# Patient Record
Sex: Female | Born: 1960 | Race: White | Hispanic: No | Marital: Married | Smoking: Never smoker
Health system: Southern US, Community
[De-identification: ages and names within clinical notes are randomized; demographics above are authoritative.]

## PROBLEM LIST (undated history)

## (undated) DIAGNOSIS — D649 Anemia, unspecified: Secondary | ICD-10-CM

## (undated) DIAGNOSIS — Z5189 Encounter for other specified aftercare: Secondary | ICD-10-CM

## (undated) DIAGNOSIS — E039 Hypothyroidism, unspecified: Secondary | ICD-10-CM

## (undated) HISTORY — PX: ABDOMINAL HYSTERECTOMY: SHX81

## (undated) HISTORY — PX: KNEE ARTHROSCOPY: SUR90

## (undated) HISTORY — DX: Hypothyroidism, unspecified: E03.9

## (undated) HISTORY — PX: TUBAL LIGATION: SHX77

---

## 1997-06-06 ENCOUNTER — Other Ambulatory Visit: Admission: RE | Admit: 1997-06-06 | Discharge: 1997-06-06 | Payer: Self-pay | Admitting: *Deleted

## 1999-07-30 ENCOUNTER — Other Ambulatory Visit: Admission: RE | Admit: 1999-07-30 | Discharge: 1999-07-30 | Payer: Self-pay | Admitting: Obstetrics and Gynecology

## 2000-04-07 ENCOUNTER — Other Ambulatory Visit: Admission: RE | Admit: 2000-04-07 | Discharge: 2000-04-07 | Payer: Self-pay | Admitting: Obstetrics and Gynecology

## 2000-04-07 ENCOUNTER — Encounter (INDEPENDENT_AMBULATORY_CARE_PROVIDER_SITE_OTHER): Payer: Self-pay | Admitting: *Deleted

## 2000-11-14 ENCOUNTER — Encounter: Admission: RE | Admit: 2000-11-14 | Discharge: 2000-11-14 | Payer: Self-pay | Admitting: Obstetrics and Gynecology

## 2000-11-14 ENCOUNTER — Encounter: Payer: Self-pay | Admitting: Obstetrics and Gynecology

## 2003-07-18 ENCOUNTER — Encounter: Admission: RE | Admit: 2003-07-18 | Discharge: 2003-07-18 | Payer: Self-pay | Admitting: Obstetrics and Gynecology

## 2004-07-23 ENCOUNTER — Encounter: Admission: RE | Admit: 2004-07-23 | Discharge: 2004-07-23 | Payer: Self-pay | Admitting: Obstetrics and Gynecology

## 2005-07-26 ENCOUNTER — Encounter: Admission: RE | Admit: 2005-07-26 | Discharge: 2005-07-26 | Payer: Self-pay | Admitting: Obstetrics and Gynecology

## 2005-10-12 ENCOUNTER — Ambulatory Visit: Payer: Self-pay | Admitting: Family Medicine

## 2006-09-29 ENCOUNTER — Encounter: Admission: RE | Admit: 2006-09-29 | Discharge: 2006-09-29 | Payer: Self-pay | Admitting: Obstetrics and Gynecology

## 2007-04-24 ENCOUNTER — Ambulatory Visit: Payer: Self-pay | Admitting: Family Medicine

## 2007-10-05 ENCOUNTER — Encounter: Admission: RE | Admit: 2007-10-05 | Discharge: 2007-10-05 | Payer: Self-pay | Admitting: Obstetrics and Gynecology

## 2007-11-13 ENCOUNTER — Ambulatory Visit: Payer: Self-pay | Admitting: Family Medicine

## 2008-10-03 ENCOUNTER — Ambulatory Visit: Payer: Self-pay | Admitting: Family Medicine

## 2009-01-18 DIAGNOSIS — IMO0001 Reserved for inherently not codable concepts without codable children: Secondary | ICD-10-CM

## 2009-01-18 DIAGNOSIS — Z5189 Encounter for other specified aftercare: Secondary | ICD-10-CM

## 2009-01-18 HISTORY — DX: Encounter for other specified aftercare: Z51.89

## 2009-01-18 HISTORY — DX: Reserved for inherently not codable concepts without codable children: IMO0001

## 2009-09-16 ENCOUNTER — Encounter: Admission: RE | Admit: 2009-09-16 | Discharge: 2009-09-16 | Payer: Self-pay | Admitting: Obstetrics and Gynecology

## 2010-01-16 ENCOUNTER — Encounter (HOSPITAL_COMMUNITY)
Admission: RE | Admit: 2010-01-16 | Discharge: 2010-02-17 | Payer: Self-pay | Source: Home / Self Care | Attending: Family Medicine | Admitting: Family Medicine

## 2010-01-16 ENCOUNTER — Ambulatory Visit
Admission: RE | Admit: 2010-01-16 | Discharge: 2010-01-16 | Payer: Self-pay | Source: Home / Self Care | Attending: Family Medicine | Admitting: Family Medicine

## 2010-03-30 LAB — CROSSMATCH
ABO/RH(D): O POS
Unit division: 0

## 2010-03-30 LAB — ABO/RH: ABO/RH(D): O POS

## 2011-01-07 ENCOUNTER — Telehealth: Payer: Self-pay | Admitting: Family Medicine

## 2011-01-07 NOTE — Telephone Encounter (Signed)
Faith Fernandez with [facility] wants [place] to add CBC to her next visit of 01/29/11 for her medical clearance.

## 2011-01-21 ENCOUNTER — Encounter (HOSPITAL_COMMUNITY): Payer: Self-pay

## 2011-01-22 ENCOUNTER — Encounter: Payer: Self-pay | Admitting: Internal Medicine

## 2011-01-25 ENCOUNTER — Other Ambulatory Visit: Payer: Self-pay | Admitting: Obstetrics

## 2011-01-28 ENCOUNTER — Encounter (HOSPITAL_COMMUNITY): Payer: Self-pay

## 2011-01-28 ENCOUNTER — Encounter (HOSPITAL_COMMUNITY)
Admission: RE | Admit: 2011-01-28 | Discharge: 2011-01-28 | Disposition: A | Discharge status: Home / Self Care | Payer: 59 | Source: Ambulatory Visit | Attending: Obstetrics | Admitting: Obstetrics

## 2011-01-28 HISTORY — DX: Anemia, unspecified: D64.9

## 2011-01-28 HISTORY — DX: Encounter for other specified aftercare: Z51.89

## 2011-01-28 NOTE — Pre-Procedure Instructions (Addendum)
Patient requested her labs be done thru [facility] b/c her husband is employee there. T&S and UPT have to be done here [facility] and I explained that to pt but she will have BMP and UA done at [facility]..I spoke with Hughes Better at Dr. Elpidio Eric office and she ok'd labs to be done at [facility].

## 2011-01-28 NOTE — Patient Instructions (Addendum)
YOUR PROCEDURE IS SCHEDULED ON:02/04/11  ENTER THROUGH THE MAIN ENTRANCE OF [facility] AT:1145 am  USE DESK PHONE AND DIAL [phone] TO INFORM us OF YOUR ARRIVAL  CALL [phone] IF YOU HAVE ANY QUESTIONS OR PROBLEMS PRIOR TO YOUR ARRIVAL.  REMEMBER: DO NOT EAT AFTER MIDNIGHT : Wed  SPECIAL INSTRUCTIONS:clear liquids ok until 9am Thursday   YOU MAY BRUSH YOUR TEETH THE MORNING OF SURGERY   TAKE THESE MEDICINES THE DAY OF SURGERY WITH SIP OF WATER:Synthroid   DO NOT WEAR JEWELRY, EYE MAKEUP, LIPSTICK OR DARK FINGERNAIL POLISH DO NOT WEAR LOTIONS  DO NOT SHAVE FOR 48 HOURS PRIOR TO SURGERY  YOU WILL NOT BE ALLOWED TO DRIVE YOURSELF HOME.  NAME OF DRIVER:David Darl Pikes

## 2011-01-29 ENCOUNTER — Ambulatory Visit (INDEPENDENT_AMBULATORY_CARE_PROVIDER_SITE_OTHER): Payer: 59 | Admitting: Family Medicine

## 2011-01-29 ENCOUNTER — Encounter: Payer: Self-pay | Admitting: Family Medicine

## 2011-01-29 VITALS — BP 124/70 | HR 64 | Ht 66.0 in | Wt 176.0 lb

## 2011-01-29 DIAGNOSIS — E039 Hypothyroidism, unspecified: Secondary | ICD-10-CM

## 2011-01-29 DIAGNOSIS — Z8249 Family history of ischemic heart disease and other diseases of the circulatory system: Secondary | ICD-10-CM | POA: Insufficient documentation

## 2011-01-29 NOTE — Patient Instructions (Signed)
Cardiology will clear you prior to surgery. We will fax over the blood work when we get it.

## 2011-01-29 NOTE — Progress Notes (Signed)
  Subjective:    Patient ID: Amiliana Foutz, female    DOB: 07/14/1960, 51 y.o.   MRN: [id number]  HPI She is here for a preoperative evaluation. She has had difficulties with menorrhagia and is scheduled for a hysterectomy. The notes from the gynecologist were reviewed. Jill Alexanders has a history of hypothyroid and continues on her thyroid medication. Her family history is significant for father having an MI at age 41. She has not ever had a cardiology evaluation. She's had no difficulty with chest pain, shortness of breath, weakness. She does not smoke.   Review of Systems     Objective:   Physical Exam alert and in no distress. Tympanic membranes and canals are normal. Throat is clear. Tonsils are normal. Neck is supple without adenopathy or thyromegaly. Cardiac exam shows a regular sinus rhythm without murmurs or gallops. Lungs are clear to auscultation. EKG shows no acute changes       Assessment & Plan:   1. Family history of heart disease in female family member before age 47  PR ELECTROCARDIOGRAM, COMPLETE, Ambulatory referral to Cardiology  2. Hypothyroid     I will check TSH and do lipid panel. Will also get blood work from her gynecologist. Refer to cardiology prior to having surgery.

## 2011-02-02 ENCOUNTER — Telehealth: Payer: Self-pay | Admitting: Internal Medicine

## 2011-02-02 MED ORDER — ATORVASTATIN CALCIUM 10 MG PO TABS: 10.0000 mg | ORAL_TABLET | Freq: Every day | ORAL | Status: DC

## 2011-02-02 NOTE — Telephone Encounter (Signed)
Her labs were done to [facility]. The LDL is elevated and I would like to start her on Lipitor 10 mg. N we'll send the lab data to Dr. Donnie Aho. She will need to have blood work done in about 2 months.

## 2011-02-02 NOTE — Telephone Encounter (Signed)
Pt was notified and filled lipitor 10mg  to [facility] and faxed over labs to [facility] and clearance to [facility]

## 2011-02-02 NOTE — Telephone Encounter (Signed)
pt called and left message on cheri voicemail stating that she wanted to know her lab results and she also wanted to see if everything was ok and had gone through for her medical clearance for her surgery? However, i do not see there were any labs done when she came in for her medical clearance appt.

## 2011-02-04 ENCOUNTER — Encounter (HOSPITAL_COMMUNITY): Payer: Self-pay | Admitting: Anesthesiology

## 2011-02-04 ENCOUNTER — Encounter (HOSPITAL_COMMUNITY)
Admission: RE | Disposition: A | Discharge status: Home / Self Care | Payer: Self-pay | Source: Ambulatory Visit | Attending: Obstetrics

## 2011-02-04 ENCOUNTER — Ambulatory Visit (HOSPITAL_COMMUNITY)
Admission: RE | Admit: 2011-02-04 | Discharge: 2011-02-05 | Disposition: A | Discharge status: Home / Self Care | Payer: 59 | Source: Ambulatory Visit | Attending: Obstetrics | Admitting: Obstetrics

## 2011-02-04 ENCOUNTER — Encounter (HOSPITAL_COMMUNITY): Payer: Self-pay | Admitting: *Deleted

## 2011-02-04 ENCOUNTER — Ambulatory Visit (HOSPITAL_COMMUNITY): Payer: 59 | Admitting: Anesthesiology

## 2011-02-04 DIAGNOSIS — Z01818 Encounter for other preprocedural examination: Secondary | ICD-10-CM | POA: Insufficient documentation

## 2011-02-04 DIAGNOSIS — N920 Excessive and frequent menstruation with regular cycle: Secondary | ICD-10-CM | POA: Insufficient documentation

## 2011-02-04 DIAGNOSIS — D259 Leiomyoma of uterus, unspecified: Secondary | ICD-10-CM | POA: Insufficient documentation

## 2011-02-04 DIAGNOSIS — N8000 Endometriosis of the uterus, unspecified: Secondary | ICD-10-CM | POA: Insufficient documentation

## 2011-02-04 DIAGNOSIS — Z01812 Encounter for preprocedural laboratory examination: Secondary | ICD-10-CM | POA: Insufficient documentation

## 2011-02-04 LAB — TYPE AND SCREEN: ABO/RH(D): O POS

## 2011-02-04 LAB — CBC
Hemoglobin: 13.8 g/dL (ref 12.0–15.0)
MCH: 33.1 pg (ref 26.0–34.0)
MCHC: 35.1 g/dL (ref 30.0–36.0)
MCV: 94.2 fL (ref 78.0–100.0)
RBC: 4.17 MIL/uL (ref 3.87–5.11)

## 2011-02-04 LAB — PREGNANCY, URINE: Preg Test, Ur: NEGATIVE

## 2011-02-04 SURGERY — ROBOTIC ASSISTED TOTAL HYSTERECTOMY
Anesthesia: General | Site: Abdomen | Wound class: Clean Contaminated

## 2011-02-04 MED ORDER — ROCURONIUM BROMIDE 50 MG/5ML IV SOLN
INTRAVENOUS | Status: AC
  Filled 2011-02-04: qty 2

## 2011-02-04 MED ORDER — KETOROLAC TROMETHAMINE 30 MG/ML IJ SOLN: 30.0000 mg | Freq: Four times a day (QID) | INTRAMUSCULAR | Status: DC

## 2011-02-04 MED ORDER — FENTANYL CITRATE 0.05 MG/ML IJ SOLN
INTRAMUSCULAR | Status: AC
  Filled 2011-02-04: qty 5

## 2011-02-04 MED ORDER — MIDAZOLAM HCL 2 MG/2ML IJ SOLN
INTRAMUSCULAR | Status: AC
  Filled 2011-02-04: qty 2

## 2011-02-04 MED ORDER — ONDANSETRON HCL 4 MG/2ML IJ SOLN
INTRAMUSCULAR | Status: DC | PRN
  Administered 2011-02-04: 4 mg via INTRAVENOUS

## 2011-02-04 MED ORDER — PHENYLEPHRINE 40 MCG/ML (10ML) SYRINGE FOR IV PUSH (FOR BLOOD PRESSURE SUPPORT)
PREFILLED_SYRINGE | INTRAVENOUS | Status: AC
  Filled 2011-02-04: qty 5

## 2011-02-04 MED ORDER — HYDROMORPHONE HCL PF 1 MG/ML IJ SOLN
1.0000 mg | INTRAMUSCULAR | Status: DC | PRN
  Administered 2011-02-04: 1 mg via INTRAVENOUS
  Filled 2011-02-04: qty 1

## 2011-02-04 MED ORDER — FENTANYL CITRATE 0.05 MG/ML IJ SOLN
INTRAMUSCULAR | Status: AC
  Filled 2011-02-04: qty 2

## 2011-02-04 MED ORDER — LACTATED RINGERS IV SOLN
INTRAVENOUS | Status: DC
  Administered 2011-02-04: 13:00:00 via INTRAVENOUS

## 2011-02-04 MED ORDER — ROCURONIUM BROMIDE 100 MG/10ML IV SOLN
INTRAVENOUS | Status: DC | PRN
  Administered 2011-02-04: 10 mg via INTRAVENOUS
  Administered 2011-02-04: 40 mg via INTRAVENOUS
  Administered 2011-02-04: 20 mg via INTRAVENOUS
  Administered 2011-02-04: 10 mg via INTRAVENOUS

## 2011-02-04 MED ORDER — GLYCOPYRROLATE 0.2 MG/ML IJ SOLN
INTRAMUSCULAR | Status: AC
  Filled 2011-02-04: qty 1

## 2011-02-04 MED ORDER — ONDANSETRON HCL 4 MG/2ML IJ SOLN
INTRAMUSCULAR | Status: AC
  Filled 2011-02-04: qty 2

## 2011-02-04 MED ORDER — LIDOCAINE HCL (CARDIAC) 20 MG/ML IV SOLN
INTRAVENOUS | Status: DC | PRN
  Administered 2011-02-04: 80 mg via INTRAVENOUS

## 2011-02-04 MED ORDER — PANTOPRAZOLE SODIUM 40 MG PO TBEC
40.0000 mg | DELAYED_RELEASE_TABLET | Freq: Every day | ORAL | Status: DC
  Filled 2011-02-04 (×2): qty 1

## 2011-02-04 MED ORDER — HYDROMORPHONE HCL PF 1 MG/ML IJ SOLN: 0.2500 mg | INTRAMUSCULAR | Status: DC | PRN

## 2011-02-04 MED ORDER — MIDAZOLAM HCL 5 MG/5ML IJ SOLN
INTRAMUSCULAR | Status: DC | PRN
  Administered 2011-02-04: 2 mg via INTRAVENOUS

## 2011-02-04 MED ORDER — IBUPROFEN 600 MG PO TABS: 600.0000 mg | ORAL_TABLET | Freq: Four times a day (QID) | ORAL | Status: DC | PRN

## 2011-02-04 MED ORDER — PROPOFOL 10 MG/ML IV EMUL
INTRAVENOUS | Status: DC | PRN
  Administered 2011-02-04: 150 mg via INTRAVENOUS
  Administered 2011-02-04: 50 mg via INTRAVENOUS

## 2011-02-04 MED ORDER — CEFAZOLIN SODIUM 1-5 GM-% IV SOLN: 1.0000 g | INTRAVENOUS | Status: DC

## 2011-02-04 MED ORDER — EPHEDRINE SULFATE 50 MG/ML IJ SOLN
INTRAMUSCULAR | Status: DC | PRN
  Administered 2011-02-04: 5 mg via INTRAVENOUS
  Administered 2011-02-04: 10 mg via INTRAVENOUS

## 2011-02-04 MED ORDER — CEFAZOLIN SODIUM 1-5 GM-% IV SOLN
INTRAVENOUS | Status: AC
  Administered 2011-02-04: 1 g via INTRAVENOUS
  Filled 2011-02-04: qty 50

## 2011-02-04 MED ORDER — SODIUM CHLORIDE 0.9 % IV SOLN
INTRAVENOUS | Status: DC
  Administered 2011-02-04 – 2011-02-05 (×2): via INTRAVENOUS

## 2011-02-04 MED ORDER — EPHEDRINE 5 MG/ML INJ
INTRAVENOUS | Status: AC
  Filled 2011-02-04: qty 10

## 2011-02-04 MED ORDER — ONDANSETRON HCL 4 MG/2ML IJ SOLN: 4.0000 mg | Freq: Four times a day (QID) | INTRAMUSCULAR | Status: DC | PRN

## 2011-02-04 MED ORDER — BUPIVACAINE HCL (PF) 0.25 % IJ SOLN
INTRAMUSCULAR | Status: DC | PRN
  Administered 2011-02-04: 14 mL

## 2011-02-04 MED ORDER — FENTANYL CITRATE 0.05 MG/ML IJ SOLN
INTRAMUSCULAR | Status: DC | PRN
  Administered 2011-02-04: 100 ug via INTRAVENOUS
  Administered 2011-02-04 (×4): 50 ug via INTRAVENOUS

## 2011-02-04 MED ORDER — BUPIVACAINE HCL (PF) 0.25 % IJ SOLN
INTRAMUSCULAR | Status: AC
  Filled 2011-02-04: qty 30

## 2011-02-04 MED ORDER — KETOROLAC TROMETHAMINE 30 MG/ML IJ SOLN
INTRAMUSCULAR | Status: AC
  Administered 2011-02-04: 30 mg via INTRAVENOUS
  Filled 2011-02-04: qty 1

## 2011-02-04 MED ORDER — PHENYLEPHRINE HCL 10 MG/ML IJ SOLN
INTRAMUSCULAR | Status: DC | PRN
  Administered 2011-02-04 (×2): 40 ug via INTRAVENOUS

## 2011-02-04 MED ORDER — KETOROLAC TROMETHAMINE 30 MG/ML IJ SOLN
30.0000 mg | Freq: Four times a day (QID) | INTRAMUSCULAR | Status: DC
  Administered 2011-02-04 – 2011-02-05 (×3): 30 mg via INTRAVENOUS
  Filled 2011-02-04 (×2): qty 1

## 2011-02-04 MED ORDER — DEXAMETHASONE SODIUM PHOSPHATE 10 MG/ML IJ SOLN
INTRAMUSCULAR | Status: AC
  Filled 2011-02-04: qty 1

## 2011-02-04 MED ORDER — DEXAMETHASONE SODIUM PHOSPHATE 10 MG/ML IJ SOLN
INTRAMUSCULAR | Status: DC | PRN
  Administered 2011-02-04: 10 mg via INTRAVENOUS

## 2011-02-04 MED ORDER — LIDOCAINE HCL (CARDIAC) 20 MG/ML IV SOLN
INTRAVENOUS | Status: AC
  Filled 2011-02-04: qty 5

## 2011-02-04 MED ORDER — ZOLPIDEM TARTRATE 5 MG PO TABS: 5.0000 mg | ORAL_TABLET | Freq: Every evening | ORAL | Status: DC | PRN

## 2011-02-04 MED ORDER — MENTHOL 3 MG MT LOZG: 1.0000 | LOZENGE | OROMUCOSAL | Status: DC | PRN

## 2011-02-04 MED ORDER — ONDANSETRON HCL 4 MG PO TABS: 4.0000 mg | ORAL_TABLET | Freq: Four times a day (QID) | ORAL | Status: DC | PRN

## 2011-02-04 MED ORDER — NEOSTIGMINE METHYLSULFATE 1 MG/ML IJ SOLN
INTRAMUSCULAR | Status: AC
  Filled 2011-02-04: qty 10

## 2011-02-04 MED ORDER — GLYCOPYRROLATE 0.2 MG/ML IJ SOLN
INTRAMUSCULAR | Status: DC | PRN
  Administered 2011-02-04: 0.1 mg via INTRAVENOUS
  Administered 2011-02-04: 1 mg via INTRAVENOUS

## 2011-02-04 MED ORDER — LACTATED RINGERS IV SOLN
INTRAVENOUS | Status: DC | PRN
  Administered 2011-02-04 (×3): via INTRAVENOUS

## 2011-02-04 MED ORDER — LACTATED RINGERS IR SOLN
Status: DC | PRN
  Administered 2011-02-04: 3000 mL

## 2011-02-04 MED ORDER — OXYCODONE-ACETAMINOPHEN 5-325 MG PO TABS
1.0000 | ORAL_TABLET | ORAL | Status: DC | PRN
  Administered 2011-02-05: 2 via ORAL
  Filled 2011-02-04: qty 2
  Filled 2011-02-04: qty 1

## 2011-02-04 MED ORDER — ARTIFICIAL TEARS OP OINT
TOPICAL_OINTMENT | OPHTHALMIC | Status: DC | PRN
  Administered 2011-02-04: 1 via OPHTHALMIC

## 2011-02-04 MED ORDER — GLYCOPYRROLATE 0.2 MG/ML IJ SOLN
INTRAMUSCULAR | Status: AC
  Filled 2011-02-04: qty 3

## 2011-02-04 MED ORDER — NEOSTIGMINE METHYLSULFATE 1 MG/ML IJ SOLN
INTRAMUSCULAR | Status: DC | PRN
  Administered 2011-02-04: 5 mg via INTRAVENOUS

## 2011-02-04 MED ORDER — PROPOFOL 10 MG/ML IV EMUL
INTRAVENOUS | Status: AC
  Filled 2011-02-04: qty 20

## 2011-02-04 SURGICAL SUPPLY — 73 items
ADH SKN CLS APL DERMABOND .7 (GAUZE/BANDAGES/DRESSINGS) ×1
BAG URINE DRAINAGE (UROLOGICAL SUPPLIES) ×2 IMPLANT
BARRIER ADHS 3X4 INTERCEED (GAUZE/BANDAGES/DRESSINGS) ×1 IMPLANT
BLADE LAPAROSCOPIC MORCELL KIT (BLADE) ×1 IMPLANT
BLADELESS LONG 8MM (BLADE) IMPLANT
BRR ADH 4X3 ABS CNTRL BYND (GAUZE/BANDAGES/DRESSINGS)
CABLE HIGH FREQUENCY MONO STRZ (ELECTRODE) ×2 IMPLANT
CATH FOLEY 3WAY  5CC 16FR (CATHETERS) ×1
CATH FOLEY 3WAY 5CC 16FR (CATHETERS) ×1 IMPLANT
CHLORAPREP W/TINT 26ML (MISCELLANEOUS) ×2 IMPLANT
CLOTH BEACON ORANGE TIMEOUT ST (SAFETY) ×2 IMPLANT
CONT PATH 16OZ SNAP LID 3702 (MISCELLANEOUS) ×2 IMPLANT
COVER MAYO STAND STRL (DRAPES) ×2 IMPLANT
COVER TABLE BACK 60X90 (DRAPES) ×4 IMPLANT
COVER TIP SHEARS 8 DVNC (MISCELLANEOUS) ×1 IMPLANT
COVER TIP SHEARS 8MM DA VINCI (MISCELLANEOUS) ×1
DECANTER SPIKE VIAL GLASS SM (MISCELLANEOUS) ×2 IMPLANT
DERMABOND ADVANCED (GAUZE/BANDAGES/DRESSINGS) ×1
DERMABOND ADVANCED .7 DNX12 (GAUZE/BANDAGES/DRESSINGS) ×2 IMPLANT
DRAPE HUG U DISPOSABLE (DRAPE) ×2 IMPLANT
DRAPE LG THREE QUARTER DISP (DRAPES) ×4 IMPLANT
DRAPE MONITOR DA VINCI (DRAPE) IMPLANT
DRAPE WARM FLUID 44X44 (DRAPE) ×2 IMPLANT
ELECT REM PT RETURN 9FT ADLT (ELECTROSURGICAL) ×2
ELECTRODE REM PT RTRN 9FT ADLT (ELECTROSURGICAL) ×1 IMPLANT
EVACUATOR SMOKE 8.L (FILTER) ×2 IMPLANT
GAUZE VASELINE 3X9 (GAUZE/BANDAGES/DRESSINGS) ×1 IMPLANT
GLOVE BIO SURGEON STRL SZ 6.5 (GLOVE) ×2 IMPLANT
GLOVE BIOGEL PI IND STRL 7.0 (GLOVE) ×2 IMPLANT
GLOVE BIOGEL PI INDICATOR 7.0 (GLOVE) ×2
GOWN STRL REIN XL XLG (GOWN DISPOSABLE) ×13 IMPLANT
IV STOPCOCK 4 WAY 40  W/Y SET (IV SOLUTION)
IV STOPCOCK 4 WAY 40 W/Y SET (IV SOLUTION) ×1 IMPLANT
KIT ACCESSORY DA VINCI DISP (KITS) ×1
KIT ACCESSORY DVNC DISP (KITS) ×1 IMPLANT
KIT DISP ACCESSORY 4 ARM (KITS) IMPLANT
NDL INSUFFLATION 14GA 120MM (NEEDLE) IMPLANT
NEEDLE HYPO 22GX1.5 SAFETY (NEEDLE) IMPLANT
NEEDLE INSUFFLATION 14GA 120MM (NEEDLE) IMPLANT
OCCLUDER COLPOPNEUMO (BALLOONS) ×1 IMPLANT
PACK LAVH (CUSTOM PROCEDURE TRAY) ×2 IMPLANT
PAD PREP 24X48 CUFFED NSTRL (MISCELLANEOUS) ×4 IMPLANT
PLUG CATH AND CAP STER (CATHETERS) ×2 IMPLANT
POSITIONER SURGICAL ARM (MISCELLANEOUS) ×5 IMPLANT
SET IRRIG TUBING LAPAROSCOPIC (IRRIGATION / IRRIGATOR) ×3 IMPLANT
SOLUTION ELECTROLUBE (MISCELLANEOUS) ×2 IMPLANT
SPONGE LAP 18X18 X RAY DECT (DISPOSABLE) IMPLANT
SUT VIC AB 0 CT1 27 (SUTURE) ×4
SUT VIC AB 0 CT1 27XBRD ANBCTR (SUTURE) ×2 IMPLANT
SUT VIC AB 0 CT1 27XBRD ANTBC (SUTURE) IMPLANT
SUT VIC AB 0 CT2 27 (SUTURE) IMPLANT
SUT VIC AB 2-0 CT1 27 (SUTURE)
SUT VIC AB 2-0 CT1 TAPERPNT 27 (SUTURE) IMPLANT
SUT VIC AB 4-0 PS2 27 (SUTURE) ×4 IMPLANT
SUT VICRYL 0 27 CT2 27 ABS (SUTURE) ×7 IMPLANT
SUT VICRYL 0 UR6 27IN ABS (SUTURE) ×4 IMPLANT
SYR 50ML LL SCALE MARK (SYRINGE) ×2 IMPLANT
SYSTEM CONVERTIBLE TROCAR (TROCAR) ×1 IMPLANT
TIP RUMI ORANGE 6.7MMX12CM (TIP) ×1 IMPLANT
TIP UTERINE 5.1X6CM LAV DISP (MISCELLANEOUS) IMPLANT
TIP UTERINE 6.7X10CM GRN DISP (MISCELLANEOUS) IMPLANT
TIP UTERINE 6.7X6CM WHT DISP (MISCELLANEOUS) IMPLANT
TIP UTERINE 6.7X8CM BLUE DISP (MISCELLANEOUS) IMPLANT
TOWEL OR 17X24 6PK STRL BLUE (TOWEL DISPOSABLE) ×6 IMPLANT
TROCAR 12M 150ML BLUNT (TROCAR) ×1 IMPLANT
TROCAR DISP BLADELESS 8 DVNC (TROCAR) ×1 IMPLANT
TROCAR DISP BLADELESS 8MM (TROCAR) ×1
TROCAR XCEL 12X100 BLDLESS (ENDOMECHANICALS) ×2 IMPLANT
TROCAR XCEL NON-BLD 5MMX100MML (ENDOMECHANICALS) ×2 IMPLANT
TROCAR Z-THREAD BLADED 12X100M (TROCAR) ×1 IMPLANT
TUBING FILTER THERMOFLATOR (ELECTROSURGICAL) ×3 IMPLANT
WARMER LAPAROSCOPE (MISCELLANEOUS) ×2 IMPLANT
WATER STERILE IRR 1000ML POUR (IV SOLUTION) ×6 IMPLANT

## 2011-02-04 NOTE — Op Note (Signed)
02/04/2011  6:01 PM  PATIENT:  Faith Fernandez  51 y.o. female  PRE-OPERATIVE DIAGNOSIS:  Menorrhagia, Fibroids  POST-OPERATIVE DIAGNOSIS:  Menorrhagia, Fibroids  PROCEDURE:  Procedure(s): ROBOTIC ASSISTED TOTAL HYSTERECTOMY  SURGEON:  Surgeon(s): Tresa Endo A. Ernestina Penna, MD Genia Del, MD  PHYSICIAN ASSISTANT:   ASSISTANTSSeymour Bars   ANESTHESIA:   general  EBL:  Total I/O In: 2000 [I.V.:2000] Out: 400 [Urine:300; Blood:100]  BLOOD ADMINISTERED:none  DRAINS: Urinary Catheter (Foley)   LOCAL MEDICATIONS USED:  MARCAINE 0.5 CC  SPECIMEN:  Source of Specimen:  uterus and cervix  DISPOSITION OF SPECIMEN:  PATHOLOGY  COUNTS:  YES  TOURNIQUET:  * No tourniquets in log *  DICTATION: .Note written in EPIC  PLAN OF CARE: Admit for overnight observation  PATIENT DISPOSITION:  PACU - hemodynamically stable.  EBL minimal Antibiotics 1 g of Ancef Findings enlarged fibroid uterus with probable adenomyosis finally 457 g, normal ovaries bilaterally, bilateral peristalsing ureters both pre-and post procedure, hemostasis post procedure    Procedure:Robotic-assisted total laparoscopic hysterectomy  Indications: This is a 51 year old female with 2 year history of bothersome menorrhagia associated with anemia. After all options have been reviewed with the patient patient opted for preprocedure Lupron to help shrink the fibroid and help resolve her anemia. After several months of Lupron patient now presents for attempted total laparoscopic hysterectomy. Given that she is premenopausal despite her EEG she opted for ovarian conservation.  Procedure: After informed consent and discussion of alternatives to hysterectomy, the patient was taken to the operating room where general anesthesia was initiated without difficulty. She was prepped and draped in normal sterile fashion in the dorsal supine lithotomy position. A Foley catheter was inserted sterilely into the bladder. A bimanual  examination was done to assess the size and position of the uterus. A weighted speculum was placed in the vagina. A vaginal wall retractor was used on the anterior vagina and  the cervix was grasped with tenaculum. The cervix was sounded with the uterine sound. It sounded to 13 cm. The cervix was assessed to identify the Rumi-Co size.A medium cup and an 12 cm shaft was used. This was easily threaded through the cervix, into the uterus and the cup seated nicely around the cervix. The uterine balloon was inflated.  Gloved were changed. Attention was then turned to the patient's abdomen. 0.5 % marcaine was used prior to all incision. A total of 20 cc of marcaine was used.  A 10 mm incision was made in the umbilicus and blunt and sharp dissection was done until the fascia was identified. This was then grasped with Kocher clamps x2 and entered sharply. A pursestring suture of 0 Vicryl was then placed along the incision and a non-bladed Roseanne Reno was inserted into the peritoneal cavity. Intraperitoneal placement was confirmed with the use of the camera and pneumoperitoneum was created to 15 mm of mercury. The pursestring suture was secured around the port and pneumoperitoneum was maintained. Brief survey of the abdomen and pelvis was done with findings as above. The abdominal wall was assessed and additional port sites were marked. 8 mm incisions were placed in the right (two) and left lower quadrants (1) and non-bladed ports were placed under direct visualization. An additional 5 mm incision was made in LLQ and 5mm non-bladed port was placed under direct visualization.  The robot was brought to the patient's side and attached with the right side docking. The robotic instruments were placed under direct visualization until proper placement just over the uterus.  I  then went to the robotic console. Brief survey of the patient's abdomen and pelvis revealed  findings as above. Bilateral ureters were identified and seen  peristalsing well low in the pelvic sidewall. Bilateral tubes and ovaries were normal. No significant adhesions were noted. I began on the right side: the  right  tube was serially cauterized with bipolar cautery with the use of the PK device and transected with the monopolar scissors. The  right utero-ovarian ligament was serially cauterized with the bipolar PK and transected with the monopolar scissors. The anterior leaf of the broad ligament was dissected bluntly then monopolar cautery was used to separate the anterior and posterior leafs of the broad ligament. This was carried down through to the bladder flap. Attention was then turned to the patient's  left  side: the  left t tube was cauterized and transected,  the  left  uterine ovarian ligament was serially cauterized with the PK and transected with the monopolar scissors. The left  broad ligament was opened up the anterior and posterior leaves were dissected apart from each other. Monopolar cautery was used to come across the anterior leaf of the right broad ligament. This dissection was carried down across to the midline to create the bladder flap. The right uterine artery was serially cauterized with the PK and transected with the monopolar scissors. The left uterine artery and then serially cauterized with the PK and transected with the monopolar scissors. The uterus was placed on stretch to allow better visualization of the arteries. The bladder flap was further taken down both sharply and with cautery. Cautery was used for hemostasis on several places along the bladder flap. At this point with the pressure inward on the Rumi, the posterior  colpotomy was made with the monopolar scissors. This was carried around to the patient's right side. Additional bipolar cautery was needed on the  both angles of the cuff- this was carried out with the PK bipolar. The uterus was then positioned  posteriorly  to allow easy access to the  anterior  colpotomy. This was  carried out with the monopolar scissors.  Good hemostasis was noted along the angles of the incision.  The uterine manipulator his were removed from the cervix and pulled out through the vagina. The uterus remains in the patient's pelvis.  Irrigation was used and the vaginal cuff appeared hemostatic. An 0 Vicryl on a CT to was then placed through the #3 laparoscopic port. Instruments were changed to allow a suture cut needle driver through the #1 port and and a long-tipped forceps through the #2 port. The right angle was closed in a figure-of-eight fashion. Another suture was placed and the left angle was closed with a figure-of-eight suture. 4 additional sutures were then thrown along the vaginal cuff, all figure of 8 sutures with 0-vicryl. Excellent hemostasis was noted. Suction irrigation was carried out and hemostasis was assured along the vaginal cuff. The utero-ovarian ligaments were reassessed also found to be hemostatic. All free fluid was removed from the abdomen. The robotic portion was completed. The robot was undocked.   By standard laparoscopy the umbilicus report was changed to the morcellator. The #8 camera was placed through the right lower quadrant and instruments were used to bring the uterus and cervix back into the pelvis. Morcellation was then carried out into the entire specimen was removed. All leftover pieces were also removed. The patient was placed in reverse Trendelenburg, all additional fluid was suctioned from the abdomen and pelvis the  cuff was reinspected and  found to be hemostatic. All pedicles were also found to be hemostatic. Under direct visualization the ports were removed. Pneumoperitoneum was released and the umbilical port was removed.  The  pursestring suture at the umbilicus was then closed. No additional fascial incisions were closed due to the small size and the nondilated nature of these incisions. A 4-0 Vicryl was used to close the additional laparoscopic ports  sites. Good hemostasis was noted.  Sponge lap and needle counts were correct x3 the patient was woken from general anesthesia having tolerated the procedure well and taken to the recovery room in a stable fashion.  Faith Fernandez A. 10/13/2010 4:14 PM

## 2011-02-04 NOTE — Brief Op Note (Signed)
02/04/2011  6:01 PM  PATIENT:  Faith Fernandez  51 y.o. female  PRE-OPERATIVE DIAGNOSIS:  Menorrhagia, Fibroids  POST-OPERATIVE DIAGNOSIS:  Menorrhagia, Fibroids  PROCEDURE:  Procedure(s): ROBOTIC ASSISTED TOTAL HYSTERECTOMY  SURGEON:  Surgeon(s): Tresa Endo A. Ernestina Penna, MD Genia Del, MD  PHYSICIAN ASSISTANT:   ASSISTANTSSeymour Bars   ANESTHESIA:   general  EBL:  Total I/O In: 2000 [I.V.:2000] Out: 400 [Urine:300; Blood:100]  BLOOD ADMINISTERED:none  DRAINS: Urinary Catheter (Foley)   LOCAL MEDICATIONS USED:  MARCAINE 0.5 CC  SPECIMEN:  Source of Specimen:  uterus and cervix  DISPOSITION OF SPECIMEN:  PATHOLOGY  COUNTS:  YES  TOURNIQUET:  * No tourniquets in log *  DICTATION: .Note written in EPIC  PLAN OF CARE: Admit for overnight observation  PATIENT DISPOSITION:  PACU - hemodynamically stable.

## 2011-02-04 NOTE — H&P (Signed)
See scanned docs media tab for full H&P  No changes to H&P Exam stable Pt agrees to proceed w/ robotic TLH, ovaries remain, for bleeding/ fibroids  Faith Fernandez A. 02/04/2011 1:58 PM

## 2011-02-04 NOTE — Anesthesia Preprocedure Evaluation (Addendum)
Anesthesia Evaluation  Patient identified by MRN, date of birth, ID band Patient awake    Reviewed: Allergy & Precautions, H&P , Patient's Chart, lab work & pertinent test results, reviewed documented beta blocker date and time   Airway Mallampati: II TM Distance: >3 FB Neck ROM: full    Dental No notable dental hx.    Pulmonary  clear to auscultation  Pulmonary exam normal       Cardiovascular regular Normal    Neuro/Psych    GI/Hepatic   Endo/Other  Hypothyroidism   Renal/GU      Musculoskeletal   Abdominal   Peds  Hematology   Anesthesia Other Findings Hb recently 14 a few days ago  Reproductive/Obstetrics                          Anesthesia Physical Anesthesia Plan  ASA: II  Anesthesia Plan: General   Post-op Pain Management:    Induction: Intravenous  Airway Management Planned: Oral ETT  Additional Equipment:   Intra-op Plan:   Post-operative Plan:   Informed Consent: I have reviewed the patients History and Physical, chart, labs and discussed the procedure including the risks, benefits and alternatives for the proposed anesthesia with the patient or authorized representative who has indicated his/her understanding and acceptance.   Dental Advisory Given and Dental advisory given  Plan Discussed with: CRNA and Surgeon  Anesthesia Plan Comments: (  Discussed  general anesthesia, including possible nausea, instrumentation of airway, sore throat,pulmonary aspiration, etc. I asked if the were any outstanding questions, or  concerns before we proceeded. )        Anesthesia Quick Evaluation

## 2011-02-04 NOTE — Transfer of Care (Signed)
Immediate Anesthesia Transfer of Care Note  Patient: Faith Fernandez  Procedure(s) Performed:  ROBOTIC ASSISTED TOTAL HYSTERECTOMY  Patient Location: PACU  Anesthesia Type: General  Level of Consciousness: awake, alert  and oriented  Airway & Oxygen Therapy: Patient Spontanous Breathing and Patient connected to nasal cannula oxygen  Post-op Assessment: Report given to PACU RN and Post -op Vital signs reviewed and stable  Post vital signs: Reviewed and stable Filed Vitals:   02/04/11 1217  BP: 117/75  Pulse: 82  Temp: 36.3 C  Resp: 18    Complications: No apparent anesthesia complications

## 2011-02-04 NOTE — Anesthesia Postprocedure Evaluation (Signed)
  Anesthesia Post-op Note  Patient: Faith Fernandez  Procedure(s) Performed:  ROBOTIC ASSISTED TOTAL HYSTERECTOMY  Patient Location: PACU  Anesthesia Type: General  Level of Consciousness: awake, alert  and oriented  Airway and Oxygen Therapy: Patient Spontanous Breathing  Post-op Pain: none  Post-op Assessment: Post-op Vital signs reviewed, Patient's Cardiovascular Status Stable, Respiratory Function Stable, Patent Airway, No signs of Nausea or vomiting and Pain level controlled  Post-op Vital Signs: Reviewed and stable  Complications: No apparent anesthesia complications

## 2011-02-04 NOTE — Anesthesia Procedure Notes (Signed)
Procedure Name: Intubation Date/Time: 02/04/2011 2:11 PM Performed by: Karleen Dolphin Pre-anesthesia Checklist: Suction available, Emergency Drugs available, Timeout performed, Patient identified and Patient being monitored Patient Re-evaluated:Patient Re-evaluated prior to inductionOxygen Delivery Method: Circle System Utilized Preoxygenation: Pre-oxygenation with 100% oxygen Intubation Type: IV induction Ventilation: Mask ventilation without difficulty Laryngoscope Size: Mac and 3 Grade View: Grade II Tube size: 7.0 mm Number of attempts: 3 Airway Equipment and Method: stylet,  lighted stylet and video-laryngoscopy Placement Confirmation: breath sounds checked- equal and bilateral,  ETT inserted through vocal cords under direct vision,  positive ETCO2 and CO2 detector Secured at: 21 cm Dental Injury: Teeth and Oropharynx as per pre-operative assessment  Difficulty Due To: Difficult Airway- due to anterior larynx

## 2011-02-05 ENCOUNTER — Other Ambulatory Visit: Payer: Self-pay | Admitting: Obstetrics

## 2011-02-05 LAB — CBC
HCT: 36.3 % (ref 36.0–46.0)
MCHC: 35 g/dL (ref 30.0–36.0)
MCV: 94.3 fL (ref 78.0–100.0)
RDW: 11.7 % (ref 11.5–15.5)

## 2011-02-05 MED ORDER — ONDANSETRON HCL 4 MG PO TABS: 4.0000 mg | ORAL_TABLET | Freq: Two times a day (BID) | ORAL | Status: AC | PRN

## 2011-02-05 MED ORDER — OXYCODONE-ACETAMINOPHEN 5-325 MG PO TABS: 1.0000 | ORAL_TABLET | ORAL | Status: AC | PRN

## 2011-02-05 MED ORDER — IBUPROFEN 600 MG PO TABS: 600.0000 mg | ORAL_TABLET | Freq: Four times a day (QID) | ORAL | Status: AC | PRN

## 2011-02-05 NOTE — Addendum Note (Signed)
Addendum  created 02/05/11 4540 by Madison Hickman, CRNA   Modules edited:Notes Section

## 2011-02-05 NOTE — Progress Notes (Signed)
POD#1  Pt notes pain controlled w/ IV meds, will transition to po meds now. Tol light breakfast, no nausea. No vaginal bleeding. No CP/ SOB. Using IS. Has been up to ambulate. Foley removed but no void yet.   OCeasar Mons Vitals:   02/04/11 2013 02/04/11 2117 02/05/11 0117 02/05/11 0536  BP:  120/80 121/70 112/78  Pulse:  79 80 82  Temp:  97.4 F (36.3 C) 98.1 F (36.7 C) 98 F (36.7 C)  TempSrc:  Oral Oral Oral  Resp:  17 17 17   Height: 5\' 5"  (1.651 m)     Weight: 79.833 kg (176 lb)     SpO2:  95% 95% 94%   I/O: + 2L, adequate Uout Gen: well appearing, no distress CV: RRR Pulm: CTAB, no crackles Abd: soft, approp tender, NT Inc: C/D/I- dermabond, no bruising LE: NT, no edema  CBC    Component Value Date/Time   WBC 9.7 02/05/2011 0530   RBC 3.85* 02/05/2011 0530   HGB 12.7 02/05/2011 0530   HCT 36.3 02/05/2011 0530   PLT 301 02/05/2011 0530   MCV 94.3 02/05/2011 0530   MCH 33.0 02/05/2011 0530   MCHC 35.0 02/05/2011 0530   RDW 11.7 02/05/2011 0530    A/P: POD# 1 s/p robotic TLH for 500g fibroid uterus - post-op. Recovering appropriately. - Dispo. Home today if voids and tol po pain meds - f/u 2 wks, warnign signs reviewed  Gumecindo Hopkin A. 02/05/2011 9:19 AM

## 2011-02-05 NOTE — Progress Notes (Signed)
Pt d/c home out in w/c  Teaching complete  Voided  With out  Problems  Tolerated po pain med well

## 2011-02-05 NOTE — Anesthesia Postprocedure Evaluation (Signed)
  Anesthesia Post-op Note  Patient: Faith Fernandez  Procedure(s) Performed:  ROBOTIC ASSISTED TOTAL HYSTERECTOMY  Patient Location: [facility]  Anesthesia Type: General  Level of Consciousness: awake, alert  and oriented  Airway and Oxygen Therapy: Patient Spontanous Breathing  Post-op Pain: none  Post-op Assessment: Post-op Vital signs reviewed and Patient's Cardiovascular Status Stable  Post-op Vital Signs: Reviewed and stable  Complications: No apparent anesthesia complications

## 2011-03-06 LAB — HM PAP SMEAR: HM PAP: NORMAL

## 2011-04-02 ENCOUNTER — Ambulatory Visit: Payer: 59 | Admitting: Family Medicine

## 2011-04-07 ENCOUNTER — Encounter: Payer: 59 | Admitting: Family Medicine

## 2011-04-22 ENCOUNTER — Telehealth: Payer: Self-pay | Admitting: Internal Medicine

## 2011-04-22 MED ORDER — LEVOTHYROXINE SODIUM 88 MCG PO TABS: 88.0000 ug | ORAL_TABLET | Freq: Every day | ORAL | Status: DC

## 2011-04-22 MED ORDER — ATORVASTATIN CALCIUM 10 MG PO TABS: 10.0000 mg | ORAL_TABLET | Freq: Every day | ORAL | Status: DC

## 2011-04-22 NOTE — Telephone Encounter (Signed)
Pt informed word for word  

## 2011-04-22 NOTE — Telephone Encounter (Signed)
Sent med in per pt request 

## 2011-04-22 NOTE — Telephone Encounter (Signed)
Pt was notified that her cholesterol panel was good. And everything was in normal range. Pt wants to know if she still needs to continue the cholesterol medicine or if she can come off of it and see what happens.and TSH was in normal range too and wants to know if she can come off the medicine as well. She does not want to take any medicine unless she has too.

## 2011-04-22 NOTE — Telephone Encounter (Signed)
Inform her that her numbers look good because she is on these medications and needs to stay on them ; explain that we have this controlled but not cured

## 2011-05-24 ENCOUNTER — Encounter: Payer: Self-pay | Admitting: Family Medicine

## 2011-07-09 ENCOUNTER — Other Ambulatory Visit: Payer: Self-pay | Admitting: Obstetrics

## 2011-07-09 DIAGNOSIS — Z1231 Encounter for screening mammogram for malignant neoplasm of breast: Secondary | ICD-10-CM

## 2011-07-15 ENCOUNTER — Ambulatory Visit
Admission: RE | Admit: 2011-07-15 | Discharge: 2011-07-15 | Disposition: A | Discharge status: Home / Self Care | Payer: 59 | Source: Ambulatory Visit | Attending: Obstetrics | Admitting: Obstetrics

## 2011-07-15 DIAGNOSIS — Z1231 Encounter for screening mammogram for malignant neoplasm of breast: Secondary | ICD-10-CM

## 2011-10-04 ENCOUNTER — Telehealth: Payer: Self-pay | Admitting: Family Medicine

## 2011-10-04 NOTE — Telephone Encounter (Signed)
FAXED LAB ORDER TO [facility] [phone]

## 2011-10-07 ENCOUNTER — Encounter: Payer: Self-pay | Admitting: Internal Medicine

## 2011-10-14 ENCOUNTER — Encounter: Payer: Self-pay | Admitting: Family Medicine

## 2011-10-25 ENCOUNTER — Ambulatory Visit (INDEPENDENT_AMBULATORY_CARE_PROVIDER_SITE_OTHER): Payer: 59 | Admitting: Family Medicine

## 2011-10-25 ENCOUNTER — Encounter: Payer: Self-pay | Admitting: Family Medicine

## 2011-10-25 VITALS — BP 114/70 | Wt 150.0 lb

## 2011-10-25 DIAGNOSIS — E039 Hypothyroidism, unspecified: Secondary | ICD-10-CM

## 2011-10-25 DIAGNOSIS — Z8249 Family history of ischemic heart disease and other diseases of the circulatory system: Secondary | ICD-10-CM

## 2011-10-25 MED ORDER — LEVOTHYROXINE SODIUM 88 MCG PO TABS: 88.0000 ug | ORAL_TABLET | Freq: Every day | ORAL | Status: DC

## 2011-10-25 NOTE — Progress Notes (Signed)
  Subjective:    Patient ID: Faith Fernandez, female    DOB: Aug 22, 1960, 51 y.o.   MRN: [id number]  HPI She is here for a medication check. She did have blood work done in several weeks ago. It was reviewed and looks good. She does have a family history of heart disease. She has lost approximately 45 pounds and is interested in stopping her cholesterol medication. She continues on her thyroid medication as well as other OTC preparations. She has no other concerns or complaints.   Review of Systems     Objective:   Physical Exam alert and in no distress. Tympanic membranes and canals are normal. Throat is clear. Tonsils are normal. Neck is supple without adenopathy or thyromegaly. Cardiac exam shows a regular sinus rhythm without murmurs or gallops. Lungs are clear to auscultation.        Assessment & Plan:   1. Hypothyroid  levothyroxine (SYNTHROID) 88 MCG tablet  2. Family history of heart disease in female family member before age 24     I recommended that she stay on the lipitor however she would like to stop and see what her numbers are. She will return here in 2 months for a recheck. I complimented her on her weight loss.

## 2011-11-01 ENCOUNTER — Telehealth: Payer: Self-pay | Admitting: Internal Medicine

## 2011-11-01 DIAGNOSIS — E039 Hypothyroidism, unspecified: Secondary | ICD-10-CM

## 2011-11-01 MED ORDER — LEVOTHYROXINE SODIUM 88 MCG PO TABS: 88.0000 ug | ORAL_TABLET | Freq: Every day | ORAL | Status: DC

## 2011-11-01 NOTE — Telephone Encounter (Signed)
Pt was in office last week and wanted a year supply for levothyroxine, i forgot to send that it until pt called back today about it. Refilled levothyroxine #90 with 3 refills. Called pt to let her know i was sorry about that and i probably got side tracked and that i just sent it to the pharmacy.. Pt states that she knew i was busy the day she was here and said it was ok.

## 2012-08-21 ENCOUNTER — Other Ambulatory Visit: Payer: Self-pay

## 2012-08-21 DIAGNOSIS — E039 Hypothyroidism, unspecified: Secondary | ICD-10-CM

## 2012-08-21 MED ORDER — LEVOTHYROXINE SODIUM 88 MCG PO TABS: 88.0000 ug | ORAL_TABLET | Freq: Every day | ORAL | Status: DC

## 2013-06-19 ENCOUNTER — Other Ambulatory Visit: Payer: Self-pay

## 2013-06-19 DIAGNOSIS — Z1231 Encounter for screening mammogram for malignant neoplasm of breast: Secondary | ICD-10-CM

## 2013-07-03 ENCOUNTER — Ambulatory Visit: Payer: 59

## 2013-07-05 ENCOUNTER — Ambulatory Visit
Admission: RE | Admit: 2013-07-05 | Discharge: 2013-07-05 | Disposition: A | Discharge status: Home / Self Care | Payer: 59 | Source: Ambulatory Visit

## 2013-07-05 ENCOUNTER — Encounter (INDEPENDENT_AMBULATORY_CARE_PROVIDER_SITE_OTHER): Payer: Self-pay

## 2013-07-05 DIAGNOSIS — Z1231 Encounter for screening mammogram for malignant neoplasm of breast: Secondary | ICD-10-CM

## 2013-07-05 LAB — HM MAMMOGRAPHY: HM Mammogram: NORMAL

## 2013-10-18 ENCOUNTER — Other Ambulatory Visit: Payer: Self-pay | Admitting: Family Medicine

## 2014-02-20 ENCOUNTER — Telehealth: Payer: Self-pay | Admitting: Family Medicine

## 2014-02-20 NOTE — Telephone Encounter (Signed)
Please call patient  Patient has meds check scheduled for next week (Thursday). She knows she needs labs but has to go to [facility] because of her insurance  She needs order to have labs done and needs to know if she needs to be fasting or not  Please call

## 2014-02-20 NOTE — Telephone Encounter (Signed)
Pt informed rx is ready for her to pick up

## 2014-02-20 NOTE — Telephone Encounter (Signed)
I want fasting labs and give her a prescription for the labs.

## 2014-02-28 ENCOUNTER — Encounter: Payer: Self-pay | Admitting: Family Medicine

## 2014-03-01 ENCOUNTER — Encounter: Payer: Self-pay | Admitting: Family Medicine

## 2014-03-05 ENCOUNTER — Ambulatory Visit (INDEPENDENT_AMBULATORY_CARE_PROVIDER_SITE_OTHER): Payer: 59 | Admitting: Family Medicine

## 2014-03-05 ENCOUNTER — Encounter: Payer: Self-pay | Admitting: Family Medicine

## 2014-03-05 VITALS — BP 120/80 | HR 57 | Wt 153.0 lb

## 2014-03-05 DIAGNOSIS — Z23 Encounter for immunization: Secondary | ICD-10-CM

## 2014-03-05 DIAGNOSIS — E039 Hypothyroidism, unspecified: Secondary | ICD-10-CM

## 2014-03-05 DIAGNOSIS — Z8249 Family history of ischemic heart disease and other diseases of the circulatory system: Secondary | ICD-10-CM

## 2014-03-05 MED ORDER — LEVOTHYROXINE SODIUM 88 MCG PO TABS: 88.0000 ug | ORAL_TABLET | Freq: Every day | ORAL | Status: DC

## 2014-03-05 NOTE — Progress Notes (Signed)
   Subjective:    Patient ID: Faith Fernandez, female    DOB: 1960-06-30, 54 y.o.   MRN: [id number]  HPI She is here for medication check. She does take thyroid medication and other supplements including chondroitin and glucosamine that she states does help her aches and pains. She does have a family history of heart disease however has no other risk factors,specifically hypertension, diabetes, smoking. She is taking care of her grandchildren. Family and social history was reviewed. She does not want a colonoscopy nor a flu shot. She did except getting a TDaP.    Review of Systems  All other systems reviewed and are negative.      Objective:   Physical Exam Alert and in no distress. Tympanic membranes and canals are normal. Pharyngeal area is normal. Neck is supple without adenopathy or thyromegaly. Cardiac exam shows a regular sinus rhythm without murmurs or gallops. Lungs are clear to auscultation. Her laboratory data was reviewed.       Assessment & Plan:  Need for Tdap vaccination  Hypothyroidism, unspecified hypothyroidism type - Plan: levothyroxine (SYNTHROID) 61 MCG tablet  Family history of heart disease in female family member before age 31 I encouraged her to continue to take good care of herself. Scuffs a statin however her numbers look good and I did not push the issue.

## 2014-03-13 ENCOUNTER — Other Ambulatory Visit: Payer: Self-pay | Admitting: Family Medicine

## 2014-03-13 ENCOUNTER — Other Ambulatory Visit: Payer: Self-pay

## 2014-03-13 ENCOUNTER — Telehealth: Payer: Self-pay | Admitting: Family Medicine

## 2014-03-13 DIAGNOSIS — E039 Hypothyroidism, unspecified: Secondary | ICD-10-CM

## 2014-03-13 NOTE — Telephone Encounter (Signed)
Pt called and states she has called [facility] as she has always gotten generic Synthroid which is the Levolthyroxine.  She states she is not getting any where with [facility].  I called [facility] [phone] t/w pharmacist Jeani Hawking she will correct to generic.

## 2014-09-19 ENCOUNTER — Encounter: Payer: Self-pay | Admitting: Gastroenterology

## 2014-09-19 ENCOUNTER — Ambulatory Visit (INDEPENDENT_AMBULATORY_CARE_PROVIDER_SITE_OTHER): Payer: 59 | Admitting: Family Medicine

## 2014-09-19 ENCOUNTER — Encounter: Payer: Self-pay | Admitting: Family Medicine

## 2014-09-19 VITALS — BP 110/64 | HR 60 | Temp 97.9°F | Wt 155.0 lb

## 2014-09-19 DIAGNOSIS — R100 Acute abdomen: Secondary | ICD-10-CM | POA: Diagnosis not present

## 2014-09-19 DIAGNOSIS — Z1211 Encounter for screening for malignant neoplasm of colon: Secondary | ICD-10-CM

## 2014-09-19 DIAGNOSIS — R109 Unspecified abdominal pain: Secondary | ICD-10-CM

## 2014-09-19 LAB — POCT URINALYSIS DIPSTICK
BILIRUBIN UA: NEGATIVE
GLUCOSE UA: NEGATIVE
KETONES UA: NEGATIVE
Leukocytes, UA: NEGATIVE
Nitrite, UA: NEGATIVE
Protein, UA: NEGATIVE
RBC UA: NEGATIVE
SPEC GRAV UA: 1.015
UROBILINOGEN UA: NEGATIVE
pH, UA: 7

## 2014-09-19 NOTE — Progress Notes (Signed)
   Subjective:    Patient ID: Faith Fernandez, female    DOB: January 03, 1961, 54 y.o.   MRN: [id number]  HPI She is here for an acute onset of pulsating, sharp pain to her suprapubic area this morning that lasted for 2 1/2 hours and went away abruptly. She states she felt fine when she woke up. Did not eat anything out of her ordinary. Had nausea and states she made herself throw up a couple of times but did not get relief from this. Also states she had a bowel movement that was normal soft brown, denies blood or pus but pain did not immediately go away after bowel movement. She states her pain was so intense that she considered going to the emergency department. Denies fever, chills, weight loss, changes in bowel habits, dysuria, hematuria, increased urinary frequency, vaginal discharge, vaginal bleeding. She denies ever having a sexually transmitted infection and denies dyspareunia. No recent travel or sick contacts. She states he had a partial hysterectomy but still has her ovaries. She has never had a colonoscopy, is not in favor of having this. States she did the stool cards last year and had negative result. Denies family history of colon cancer but states her mother did have polyps.  Reviewed allergies, indications, past medical history, surgical history, family history.   Review of Systems    Review of Systems Constitutional: -fever, -chills, -sweats, -unexpected weight change,-fatigue ENT: -runny nose, -ear pain, -sore throat Gastroenterology: +abdominal pain, +nausea, -vomiting, -diarrhea, -constipation, -hematochezia  Musculoskeletal: -arthralgias, -myalgias, -joint swelling, -back pain Urology: -dysuria, -difficulty urinating, -hematuria, -urinary frequency, -urgency      Objective:   Physical Exam  Constitutional: She appears well-developed and well-nourished. No distress.  Pulmonary/Chest: Effort normal and breath sounds normal. No respiratory distress. She has no wheezes. She has no  rales.  Abdominal: Soft. Bowel sounds are normal. She exhibits no distension and no mass. There is no hepatosplenomegaly. There is no rebound, no guarding and no CVA tenderness.  Mild tenderness to suprapubic area.   Genitourinary: Vagina normal. Right adnexum displays no mass, no tenderness and no fullness. Left adnexum displays no mass, no tenderness and no fullness. No tenderness in the vagina. No vaginal discharge found.  Skin: Skin is warm and dry. No rash noted.  Psychiatric: She has a normal mood and affect. Her speech is normal and behavior is normal. Judgment and thought content normal. Cognition and memory are normal.      Urinalysis dipstick negative    Assessment & Plan:  Abdominal pain, acute - Plan: POCT urinalysis dipstick  Screening for colon cancer - Plan: Ambulatory referral to Gastroenterology  Discussed that there is no good explanation for the lower abdominal pain that she had earlier today. Her pelvic exam was normal and urine was also normal. We discussed the possibility of an early diverticulitis and also discussed that there is no real good screening test for ovarian cancer. Offered her the possibility of a pelvic ultrasound to look at her ovaries and also offered to refer for colonoscopy. We also discussed the option to wait and see if she has any additional pain or symptoms. She agreed for a GI referral for a screening colonoscopy. She will let us know if the pain returns. Discussed that if the pain does return and our office is not open that she would consider going to the emergency department for further evaluation.

## 2014-09-19 NOTE — Patient Instructions (Signed)
Abdominal Pain, Women °Abdominal (stomach, pelvic, or belly) pain can be caused by many things. It is important to tell your doctor: °· The location of the pain. °· Does it come and go or is it present all the time? °· Are there things that start the pain (eating certain foods, exercise)? °· Are there other symptoms associated with the pain (fever, nausea, vomiting, diarrhea)? °All of this is helpful to know when trying to find the cause of the pain. °CAUSES  °· Stomach: virus or bacteria infection, or ulcer. °· Intestine: appendicitis (inflamed appendix), regional ileitis (Crohn's disease), ulcerative colitis (inflamed colon), irritable bowel syndrome, diverticulitis (inflamed diverticulum of the colon), or cancer of the stomach or intestine. °· Gallbladder disease or stones in the gallbladder. °· Kidney disease, kidney stones, or infection. °· Pancreas infection or cancer. °· Fibromyalgia (pain disorder). °· Diseases of the female organs: °¨ Uterus: fibroid (non-cancerous) tumors or infection. °¨ Fallopian tubes: infection or tubal pregnancy. °¨ Ovary: cysts or tumors. °¨ Pelvic adhesions (scar tissue). °¨ Endometriosis (uterus lining tissue growing in the pelvis and on the pelvic organs). °¨ Pelvic congestion syndrome (female organs filling up with blood just before the menstrual period). °¨ Pain with the menstrual period. °¨ Pain with ovulation (producing an egg). °¨ Pain with an IUD (intrauterine device, birth control) in the uterus. °¨ Cancer of the female organs. °· Functional pain (pain not caused by a disease, may improve without treatment). °· Psychological pain. °· Depression. °DIAGNOSIS  °Your doctor will decide the seriousness of your pain by doing an examination. °· Blood tests. °· X-rays. °· Ultrasound. °· CT scan (computed tomography, special type of X-ray). °· MRI (magnetic resonance imaging). °· Cultures, for infection. °· Barium enema (dye inserted in the large intestine, to better view it with  X-rays). °· Colonoscopy (looking in intestine with a lighted tube). °· Laparoscopy (minor surgery, looking in abdomen with a lighted tube). °· Major abdominal exploratory surgery (looking in abdomen with a large incision). °TREATMENT  °The treatment will depend on the cause of the pain.  °· Many cases can be observed and treated at home. °· Over-the-counter medicines recommended by your caregiver. °· Prescription medicine. °· Antibiotics, for infection. °· Birth control pills, for painful periods or for ovulation pain. °· Hormone treatment, for endometriosis. °· Nerve blocking injections. °· Physical therapy. °· Antidepressants. °· Counseling with a psychologist or psychiatrist. °· Minor or major surgery. °HOME CARE INSTRUCTIONS  °· Do not take laxatives, unless directed by your caregiver. °· Take over-the-counter pain medicine only if ordered by your caregiver. Do not take aspirin because it can cause an upset stomach or bleeding. °· Try a clear liquid diet (broth or water) as ordered by your caregiver. Slowly move to a bland diet, as tolerated, if the pain is related to the stomach or intestine. °· Have a thermometer and take your temperature several times a day, and record it. °· Bed rest and sleep, if it helps the pain. °· Avoid sexual intercourse, if it causes pain. °· Avoid stressful situations. °· Keep your follow-up appointments and tests, as your caregiver orders. °· If the pain does not go away with medicine or surgery, you may try: °¨ Acupuncture. °¨ Relaxation exercises (yoga, meditation). °¨ Group therapy. °¨ Counseling. °SEEK MEDICAL CARE IF:  °· You notice certain foods cause stomach pain. °· Your home care treatment is not helping your pain. °· You need stronger pain medicine. °· You want your IUD removed. °· You feel faint or   lightheaded. °· You develop nausea and vomiting. °· You develop a rash. °· You are having side effects or an allergy to your medicine. °SEEK IMMEDIATE MEDICAL CARE IF:  °· Your  pain does not go away or gets worse. °· You have a fever. °· Your pain is felt only in portions of the abdomen. The right side could possibly be appendicitis. The left lower portion of the abdomen could be colitis or diverticulitis. °· You are passing blood in your stools (bright red or black tarry stools, with or without vomiting). °· You have blood in your urine. °· You develop chills, with or without a fever. °· You pass out. °MAKE SURE YOU:  °· Understand these instructions. °· Will watch your condition. °· Will get help right away if you are not doing well or get worse. °Document Released: 11/01/2006 Document Revised: 05/21/2013 Document Reviewed: 11/21/2008 °ExitCare® Patient Information ©2015 [employer]. This information is not intended to replace advice given to you by your health care provider. Make sure you discuss any questions you have with your health care provider. ° °

## 2014-11-18 ENCOUNTER — Ambulatory Visit (AMBULATORY_SURGERY_CENTER): Payer: Self-pay

## 2014-11-18 VITALS — Ht 65.0 in | Wt 158.4 lb

## 2014-11-18 DIAGNOSIS — Z1211 Encounter for screening for malignant neoplasm of colon: Secondary | ICD-10-CM

## 2014-11-18 MED ORDER — SUPREP BOWEL PREP KIT 17.5-3.13-1.6 GM/177ML PO SOLN: 1.0000 | Freq: Once | ORAL | Status: DC

## 2014-11-18 NOTE — Progress Notes (Signed)
No allergies to eggs or soy No diet/weight loss meds No home oxygen No past problems with anesthesia  Has email  Emmi instructions given for colonoscopy 

## 2014-11-26 ENCOUNTER — Encounter: Payer: Self-pay | Admitting: Gastroenterology

## 2014-11-27 ENCOUNTER — Encounter: Payer: Self-pay | Admitting: Gastroenterology

## 2014-12-02 ENCOUNTER — Encounter: Payer: Self-pay | Admitting: Gastroenterology

## 2014-12-02 ENCOUNTER — Ambulatory Visit (AMBULATORY_SURGERY_CENTER): Payer: 59 | Admitting: Gastroenterology

## 2014-12-02 VITALS — BP 103/48 | HR 58 | Temp 96.9°F | Resp 19 | Ht 65.0 in | Wt 158.0 lb

## 2014-12-02 DIAGNOSIS — Z1211 Encounter for screening for malignant neoplasm of colon: Secondary | ICD-10-CM

## 2014-12-02 DIAGNOSIS — D122 Benign neoplasm of ascending colon: Secondary | ICD-10-CM

## 2014-12-02 MED ORDER — SODIUM CHLORIDE 0.9 % IV SOLN: 500.0000 mL | INTRAVENOUS | Status: DC

## 2014-12-02 NOTE — Patient Instructions (Signed)
YOU HAD AN ENDOSCOPIC PROCEDURE TODAY AT THE [facility]:   Refer to the procedure report that was given to you for any specific questions about what was found during the examination.  If the procedure report does not answer your questions, please call your gastroenterologist to clarify.  If you requested that your care partner not be given the details of your procedure findings, then the procedure report has been included in a sealed envelope for you to review at your convenience later.  YOU SHOULD EXPECT: Some feelings of bloating in the abdomen. Passage of more gas than usual.  Walking can help get rid of the air that was put into your GI tract during the procedure and reduce the bloating. If you had a lower endoscopy (such as a colonoscopy or flexible sigmoidoscopy) you may notice spotting of blood in your stool or on the toilet paper. If you underwent a bowel prep for your procedure, you may not have a normal bowel movement for a few days.  Please Note:  You might notice some irritation and congestion in your nose or some drainage.  This is from the oxygen used during your procedure.  There is no need for concern and it should clear up in a day or so.  SYMPTOMS TO REPORT IMMEDIATELY:   Following lower endoscopy (colonoscopy or flexible sigmoidoscopy):  Excessive amounts of blood in the stool  Significant tenderness or worsening of abdominal pains  Swelling of the abdomen that is new, acute  Fever of 100F or higher   For urgent or emergent issues, a gastroenterologist can be reached at any hour by calling [phone].   DIET: Your first meal following the procedure should be a small meal and then it is ok to progress to your normal diet. Heavy or fried foods are harder to digest and may make you feel nauseous or bloated.  Likewise, meals heavy in dairy and vegetables can increase bloating.  Drink plenty of fluids but you should avoid alcoholic beverages for 24  hours.  ACTIVITY:  You should plan to take it easy for the rest of today and you should NOT DRIVE or use heavy machinery until tomorrow (because of the sedation medicines used during the test).    FOLLOW UP: Our staff will call the number listed on your records the next business day following your procedure to check on you and address any questions or concerns that you may have regarding the information given to you following your procedure. If we do not reach you, we will leave a message.  However, if you are feeling well and you are not experiencing any problems, there is no need to return our call.  We will assume that you have returned to your regular daily activities without incident.  If any biopsies were taken you will be contacted by phone or by letter within the next 1-3 weeks.  Please call us at [phone] if you have not heard about the biopsies in 3 weeks.    SIGNATURES/CONFIDENTIALITY: You and/or your care partner have signed paperwork which will be entered into your electronic medical record.  These signatures attest to the fact that that the information above on your After Visit Summary has been reviewed and is understood.  Full responsibility of the confidentiality of this discharge information lies with you and/or your care-partner.  Read all of the handouts given to you by your recovery room nurse. 

## 2014-12-02 NOTE — Op Note (Signed)
[place]  [address] [place], [address]   COLONOSCOPY PROCEDURE REPORT  PATIENT: Faith Fernandez, Faith Fernandez  MR#: [id number] BIRTHDATE: [birth year] , 62  yrs. old GENDER: female ENDOSCOPIST: Ladene Artist, MD, Marval Regal REFERRED BY:  Jill Alexanders, M.D. PROCEDURE DATE:  12/02/2014 PROCEDURE:   Colonoscopy, screening and Colonoscopy with snare polypectomy First Screening Colonoscopy - Avg.  risk and is 50 yrs.  old or older Yes.  Prior Negative Screening - Now for repeat screening. N/A  History of Adenoma - Now for follow-up colonoscopy & has been > or = to 3 yrs.  N/A  Polyps removed today? Yes ASA CLASS:   Class II INDICATIONS:Screening for colonic neoplasia and Colorectal Neoplasm Risk Assessment for this procedure is average risk. MEDICATIONS: Monitored anesthesia care and Propofol 200 mg IV DESCRIPTION OF PROCEDURE:   After the risks benefits and alternatives of the procedure were thoroughly explained, informed consent was obtained.  The digital rectal exam revealed no abnormalities of the rectum.   The LB [id number]  endoscope was introduced through the anus and advanced to the cecum, which was identified by both the appendix and ileocecal valve. No adverse events experienced with a tortuous colon.   The quality of the prep was good.  (Suprep was used)  The instrument was then slowly withdrawn as the colon was fully examined. Estimated blood loss is zero unless otherwise noted in this procedure report.    COLON FINDINGS: A sessile polyp measuring 6 mm in size was found in the ascending colon.  A polypectomy was performed with a cold snare.  The resection was complete, the polyp tissue was completely retrieved and sent to histology.   The colonic mucosa appeared normal in the descending colon, at the splenic flexure, in the rectum, sigmoid colon, at the ileocecal valve, cecum, in the transverse colon, and at the hepatic flexure.  Retroflexed views revealed no  abnormalities. The time to cecum = 5.1 Withdrawal time = 9.9   The scope was withdrawn and the procedure completed. COMPLICATIONS: There were no immediate complications.  ENDOSCOPIC IMPRESSION: 1.   Sessile polyp in the ascending colon; polypectomy performed with a cold snare 2.   The colonic mucosa otherwise appeared normal  RECOMMENDATIONS: 1.  Await pathology results 2.  Repeat colonoscopy in 5 years if polyp adenomatous; otherwise 10 years  eSigned:  Ladene Artist, MD, [facility] 12/02/2014 10:50 AM   [C

## 2014-12-02 NOTE — Progress Notes (Signed)
Report to PACU, RN, vss, BBS= Clear.  

## 2014-12-02 NOTE — Progress Notes (Signed)
Called to room to assist during endoscopic procedure.  Patient ID and intended procedure confirmed with present staff. Received instructions for my participation in the procedure from the performing physician.  

## 2014-12-03 ENCOUNTER — Telehealth: Payer: Self-pay | Admitting: Emergency Medicine

## 2014-12-03 NOTE — Telephone Encounter (Signed)
  Follow up Call-  Call back number 12/02/2014  Post procedure Call Back phone  # [phone]  Permission to leave phone message Yes     Patient questions:  Do you have a fever, pain , or abdominal swelling? No. Pain Score  0 *  Have you tolerated food without any problems? Yes.    Have you been able to return to your normal activities? Yes.    Do you have any questions about your discharge instructions: Diet   No. Medications  No. Follow up visit  No.  Do you have questions or concerns about your Care? No.  Actions: * If pain score is 4 or above: No action needed, pain <4.

## 2014-12-06 ENCOUNTER — Other Ambulatory Visit: Payer: Self-pay

## 2014-12-06 DIAGNOSIS — Z1231 Encounter for screening mammogram for malignant neoplasm of breast: Secondary | ICD-10-CM

## 2014-12-10 ENCOUNTER — Encounter: Payer: Self-pay | Admitting: Gastroenterology

## 2014-12-26 ENCOUNTER — Ambulatory Visit: Payer: 59

## 2015-01-07 ENCOUNTER — Ambulatory Visit
Admission: RE | Admit: 2015-01-07 | Discharge: 2015-01-07 | Disposition: A | Discharge status: Home / Self Care | Payer: 59 | Source: Ambulatory Visit

## 2015-01-07 DIAGNOSIS — Z1231 Encounter for screening mammogram for malignant neoplasm of breast: Secondary | ICD-10-CM

## 2015-01-28 ENCOUNTER — Other Ambulatory Visit: Payer: Self-pay | Admitting: Family Medicine

## 2015-05-12 ENCOUNTER — Other Ambulatory Visit (HOSPITAL_COMMUNITY): Payer: Self-pay | Admitting: *Deleted

## 2015-05-25 ENCOUNTER — Other Ambulatory Visit: Payer: Self-pay | Admitting: Family Medicine

## 2015-11-08 ENCOUNTER — Other Ambulatory Visit: Payer: Self-pay | Admitting: Family Medicine

## 2015-11-10 NOTE — Telephone Encounter (Signed)
Left message for pt to call and make an appointment before refills

## 2015-11-11 ENCOUNTER — Telehealth: Payer: Self-pay | Admitting: Family Medicine

## 2015-11-11 NOTE — Telephone Encounter (Signed)
I wrote the orders.

## 2015-11-11 NOTE — Telephone Encounter (Signed)
Pt coming in for Med Ck on 11/21/15. She would like for lab orders to be sent to [facility] on [facility] so she can get labs done there before appointment. Call pt when order sent to [facility].

## 2015-11-12 NOTE — Telephone Encounter (Signed)
Pt informed to come pick up orders

## 2015-11-21 ENCOUNTER — Encounter: Payer: 59 | Admitting: Family Medicine

## 2015-11-21 ENCOUNTER — Encounter: Payer: Self-pay | Admitting: Family Medicine

## 2015-11-27 ENCOUNTER — Ambulatory Visit (INDEPENDENT_AMBULATORY_CARE_PROVIDER_SITE_OTHER): Payer: 59 | Admitting: Family Medicine

## 2015-11-27 ENCOUNTER — Encounter: Payer: Self-pay | Admitting: Family Medicine

## 2015-11-27 VITALS — BP 110/60 | HR 76 | Resp 18 | Wt 141.0 lb

## 2015-11-27 DIAGNOSIS — Z1159 Encounter for screening for other viral diseases: Secondary | ICD-10-CM | POA: Diagnosis not present

## 2015-11-27 DIAGNOSIS — Z8249 Family history of ischemic heart disease and other diseases of the circulatory system: Secondary | ICD-10-CM | POA: Diagnosis not present

## 2015-11-27 DIAGNOSIS — E039 Hypothyroidism, unspecified: Secondary | ICD-10-CM

## 2015-11-27 MED ORDER — LEVOTHYROXINE SODIUM 88 MCG PO TABS: 88.0000 ug | ORAL_TABLET | Freq: Every day | ORAL | 3 refills | Status: DC

## 2015-11-27 NOTE — Progress Notes (Signed)
   Subjective:    Patient ID: Faith Fernandez, female    DOB: 1960/05/29, 55 y.o.   MRN: [id number]  HPI She is here for an interval evaluation. She does have underlying hypothyroid disease. Recent blood work was drawn however that test was not ordered. She is having no skin or hair changes, hot or cold intolerance. He is on multiple OTC medications and is having no difficulty with them. There is a family history of heart disease however her blood work in the past including LDL is always been in a reasonable range. She exercises regularly. She does not smoke and rarely drinks. Family and social history as well as health maintenance and immunizations were reviewed. He has had a hysterectomy. She has no other concerns or complaints.   Review of Systems Negative except as above    Objective:   Physical Exam Alert and in no distress. Tympanic membranes and canals are normal. Pharyngeal area is normal. Neck is supple without adenopathy or thyromegaly. Cardiac exam shows a regular sinus rhythm without murmurs or gallops. Lungs are clear to auscultation.        Assessment & Plan:  Family history of heart disease in female family member before age 61  Hypothyroidism, unspecified type - Plan: levothyroxine (SYNTHROID, LEVOTHROID) 6 MCG tablet  Need for hepatitis C screening test  She is taking excellent care of herself and I recommended she continue. She was not interested in the flu shot. She will schedule for a mammogram.

## 2017-01-03 ENCOUNTER — Other Ambulatory Visit: Payer: Self-pay | Admitting: Family Medicine

## 2017-01-03 DIAGNOSIS — E039 Hypothyroidism, unspecified: Secondary | ICD-10-CM

## 2017-01-04 ENCOUNTER — Telehealth: Payer: Self-pay | Admitting: Family Medicine

## 2017-01-04 ENCOUNTER — Other Ambulatory Visit: Payer: Self-pay | Admitting: Family Medicine

## 2017-01-04 DIAGNOSIS — E039 Hypothyroidism, unspecified: Secondary | ICD-10-CM

## 2017-01-04 MED ORDER — LEVOTHYROXINE SODIUM 88 MCG PO TABS: 88.0000 ug | ORAL_TABLET | Freq: Every day | ORAL | 0 refills | Status: DC

## 2017-01-04 NOTE — Telephone Encounter (Signed)
Called pt she cant come in until after first of year.  She will call me back.   Pt needs cpe or med check fasting.

## 2017-01-06 ENCOUNTER — Telehealth: Payer: Self-pay | Admitting: Family Medicine

## 2017-01-06 DIAGNOSIS — E039 Hypothyroidism, unspecified: Secondary | ICD-10-CM

## 2017-01-06 MED ORDER — LEVOTHYROXINE SODIUM 88 MCG PO TABS: 88.0000 ug | ORAL_TABLET | Freq: Every day | ORAL | 0 refills | Status: DC

## 2017-01-06 NOTE — Telephone Encounter (Signed)
Rcvd refill request from [facility] for Synthroid  0.088 mg #90. Refill should have been sent to [facility]

## 2017-01-06 NOTE — Telephone Encounter (Signed)
rx sent to [facility]

## 2017-01-24 ENCOUNTER — Other Ambulatory Visit: Payer: Self-pay | Admitting: Obstetrics

## 2017-01-24 DIAGNOSIS — Z1231 Encounter for screening mammogram for malignant neoplasm of breast: Secondary | ICD-10-CM

## 2017-01-31 ENCOUNTER — Encounter: Payer: 59 | Admitting: Family Medicine

## 2017-02-11 ENCOUNTER — Ambulatory Visit
Admission: RE | Admit: 2017-02-11 | Discharge: 2017-02-11 | Disposition: A | Discharge status: Home / Self Care | Payer: 59 | Source: Ambulatory Visit | Attending: Obstetrics | Admitting: Obstetrics

## 2017-02-11 DIAGNOSIS — Z1231 Encounter for screening mammogram for malignant neoplasm of breast: Secondary | ICD-10-CM

## 2017-02-25 ENCOUNTER — Encounter: Payer: 59 | Admitting: Family Medicine

## 2017-04-10 ENCOUNTER — Other Ambulatory Visit: Payer: Self-pay | Admitting: Family Medicine

## 2017-04-10 DIAGNOSIS — E039 Hypothyroidism, unspecified: Secondary | ICD-10-CM

## 2017-04-21 ENCOUNTER — Ambulatory Visit (INDEPENDENT_AMBULATORY_CARE_PROVIDER_SITE_OTHER): Payer: 59 | Admitting: Family Medicine

## 2017-04-21 ENCOUNTER — Encounter: Payer: Self-pay | Admitting: Family Medicine

## 2017-04-21 VITALS — BP 120/64 | HR 66 | Wt 135.4 lb

## 2017-04-21 DIAGNOSIS — J301 Allergic rhinitis due to pollen: Secondary | ICD-10-CM | POA: Diagnosis not present

## 2017-04-21 DIAGNOSIS — Z8249 Family history of ischemic heart disease and other diseases of the circulatory system: Secondary | ICD-10-CM

## 2017-04-21 DIAGNOSIS — Z1159 Encounter for screening for other viral diseases: Secondary | ICD-10-CM | POA: Diagnosis not present

## 2017-04-21 DIAGNOSIS — E039 Hypothyroidism, unspecified: Secondary | ICD-10-CM | POA: Diagnosis not present

## 2017-04-21 DIAGNOSIS — Z860100 Personal history of colon polyps, unspecified: Secondary | ICD-10-CM | POA: Insufficient documentation

## 2017-04-21 NOTE — Progress Notes (Signed)
   Subjective:    Patient ID: Faith Fernandez, female    DOB: 08-May-1960, 57 y.o.   MRN: [id number]  HPI She is here for medication management visit.  She continues on her thyroid and is having no difficulty with that.  She also has allergies and does occasionally use Claritin for this.  She continues on multiple OTC medications and is having no difficulty.  She does have a history of colonic polyps and is scheduled for another colonoscopy in 2021.  She has no other concerns or complaints.  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  Life in general is going very well for her.  Family and social history was reviewed as well as health maintenance and immunizations   Review of Systems     Objective:   Physical Exam Alert and in no distress. Tympanic membranes and canals are normal. Pharyngeal area is normal. Neck is supple without adenopathy or thyromegaly. Cardiac exam shows a regular sinus rhythm without murmurs or gallops. Lungs are clear to auscultation. She is up-to-date on her immunizations.       Assessment & Plan:  Hypothyroidism, unspecified type - Plan: TSH  Family history of heart disease in female family member before age 100 - Plan: Lipid panel, Comprehensive metabolic panel, CBC with Differential/Platelet  History of colonic polyps - Sessile serrated polyp  Seasonal allergic rhinitis due to pollen  Need for hepatitis C screening test - Plan: Hepatitis C antibody I encouraged her to continue to take good care of herself.

## 2017-04-22 LAB — CBC WITH DIFFERENTIAL/PLATELET
Basophils Absolute: 0 10*3/uL (ref 0.0–0.2)
Basos: 1 %
EOS (ABSOLUTE): 0.1 10*3/uL (ref 0.0–0.4)
EOS: 3 %
HEMATOCRIT: 41.4 % (ref 34.0–46.6)
HEMOGLOBIN: 14.2 g/dL (ref 11.1–15.9)
IMMATURE GRANS (ABS): 0 10*3/uL (ref 0.0–0.1)
IMMATURE GRANULOCYTES: 0 %
LYMPHS: 33 %
Lymphocytes Absolute: 1.1 10*3/uL (ref 0.7–3.1)
MCH: 31.3 pg (ref 26.6–33.0)
MCHC: 34.3 g/dL (ref 31.5–35.7)
MCV: 91 fL (ref 79–97)
MONOCYTES: 6 %
Monocytes Absolute: 0.2 10*3/uL (ref 0.1–0.9)
NEUTROS PCT: 57 %
Neutrophils Absolute: 1.9 10*3/uL (ref 1.4–7.0)
Platelets: 247 10*3/uL (ref 150–379)
RBC: 4.54 x10E6/uL (ref 3.77–5.28)
RDW: 13.1 % (ref 12.3–15.4)
WBC: 3.3 10*3/uL — ABNORMAL LOW (ref 3.4–10.8)

## 2017-04-22 LAB — COMPREHENSIVE METABOLIC PANEL
ALT: 12 IU/L (ref 0–32)
AST: 20 IU/L (ref 0–40)
Albumin/Globulin Ratio: 2.1 (ref 1.2–2.2)
Albumin: 4.6 g/dL (ref 3.5–5.5)
Alkaline Phosphatase: 56 IU/L (ref 39–117)
BUN/Creatinine Ratio: 15 (ref 9–23)
BUN: 11 mg/dL (ref 6–24)
Bilirubin Total: 0.5 mg/dL (ref 0.0–1.2)
CALCIUM: 9.6 mg/dL (ref 8.7–10.2)
CO2: 28 mmol/L (ref 20–29)
CREATININE: 0.73 mg/dL (ref 0.57–1.00)
Chloride: 100 mmol/L (ref 96–106)
GFR calc Af Amer: 105 mL/min/{1.73_m2} (ref >59)
GFR, EST NON AFRICAN AMERICAN: 91 mL/min/{1.73_m2} (ref >59)
GLOBULIN, TOTAL: 2.2 g/dL (ref 1.5–4.5)
GLUCOSE: 80 mg/dL (ref 65–99)
Potassium: 4.1 mmol/L (ref 3.5–5.2)
SODIUM: 140 mmol/L (ref 134–144)
Total Protein: 6.8 g/dL (ref 6.0–8.5)

## 2017-04-22 LAB — LIPID PANEL
CHOL/HDL RATIO: 2.8 ratio (ref 0.0–4.4)
Cholesterol, Total: 218 mg/dL — ABNORMAL HIGH (ref 100–199)
HDL: 78 mg/dL (ref >39)
LDL Calculated: 126 mg/dL — ABNORMAL HIGH (ref 0–99)
TRIGLYCERIDES: 69 mg/dL (ref 0–149)
VLDL Cholesterol Cal: 14 mg/dL (ref 5–40)

## 2017-04-22 LAB — TSH: TSH: 3.03 u[IU]/mL (ref 0.450–4.500)

## 2017-04-22 LAB — HEPATITIS C ANTIBODY: Hep C Virus Ab: <0.1 s/co ratio (ref 0.0–0.9)

## 2017-04-22 MED ORDER — LEVOTHYROXINE SODIUM 88 MCG PO TABS: 88.0000 ug | ORAL_TABLET | Freq: Every day | ORAL | 3 refills | Status: AC

## 2017-04-22 NOTE — Addendum Note (Signed)
Addended by: Denita Lung on: 04/22/2017 08:16 AM   Modules accepted: Orders

## 2017-09-26 DIAGNOSIS — Z01419 Encounter for gynecological examination (general) (routine) without abnormal findings: Secondary | ICD-10-CM | POA: Diagnosis not present

## 2017-09-26 DIAGNOSIS — Z6823 Body mass index (BMI) 23.0-23.9, adult: Secondary | ICD-10-CM | POA: Diagnosis not present

## 2019-11-19 ENCOUNTER — Other Ambulatory Visit: Payer: BLUE CROSS/BLUE SHIELD

## 2019-11-19 DIAGNOSIS — Z20822 Contact with and (suspected) exposure to covid-19: Secondary | ICD-10-CM

## 2019-11-20 LAB — NOVEL CORONAVIRUS, NAA: SARS-CoV-2, NAA: NOT DETECTED

## 2019-11-20 LAB — SARS-COV-2, NAA 2 DAY TAT

## 2019-12-22 IMAGING — MG 2D DIGITAL SCREENING BILATERAL MAMMOGRAM WITH 3D TOMO WITH CAD
9 of 12 series · 9 of 28 positions shown · non-contrast
Comparison: Previous exam(s).

CLINICAL DATA: Screening.

EXAM:
2D DIGITAL SCREENING BILATERAL MAMMOGRAM WITH 3D TOMO WITH CAD

[R MLO]
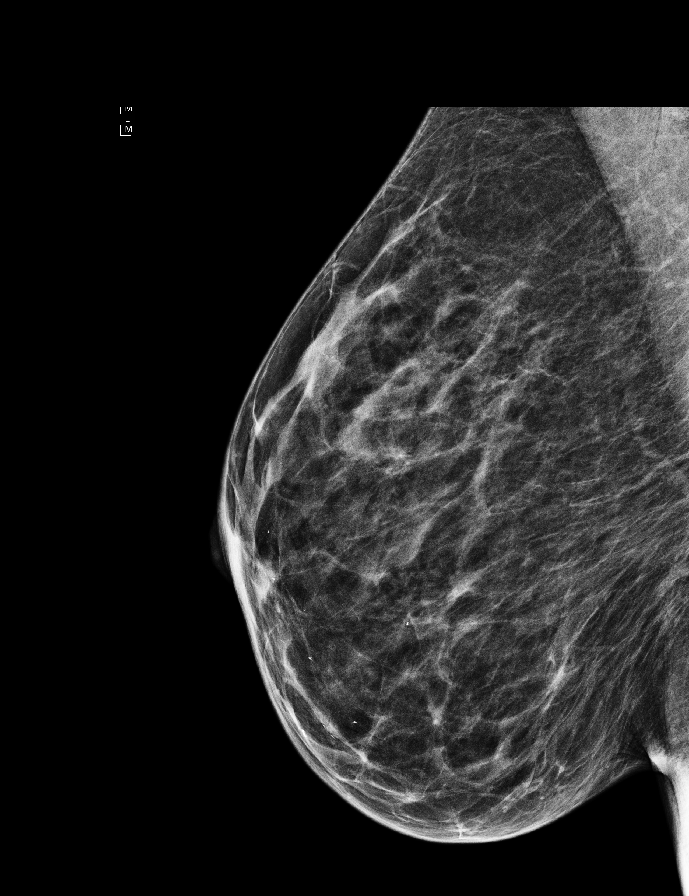

[R CC]
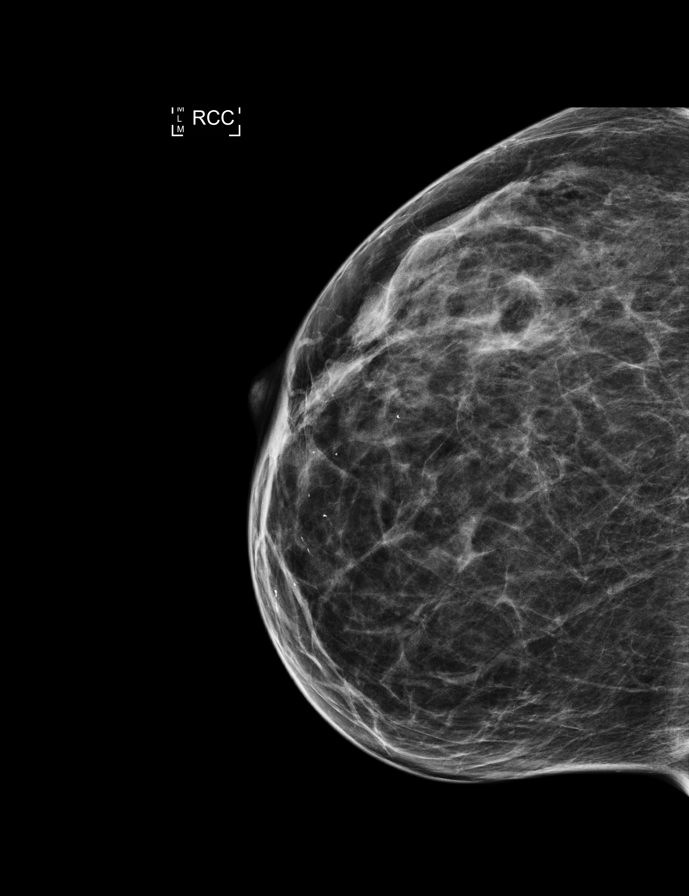

[L CC]
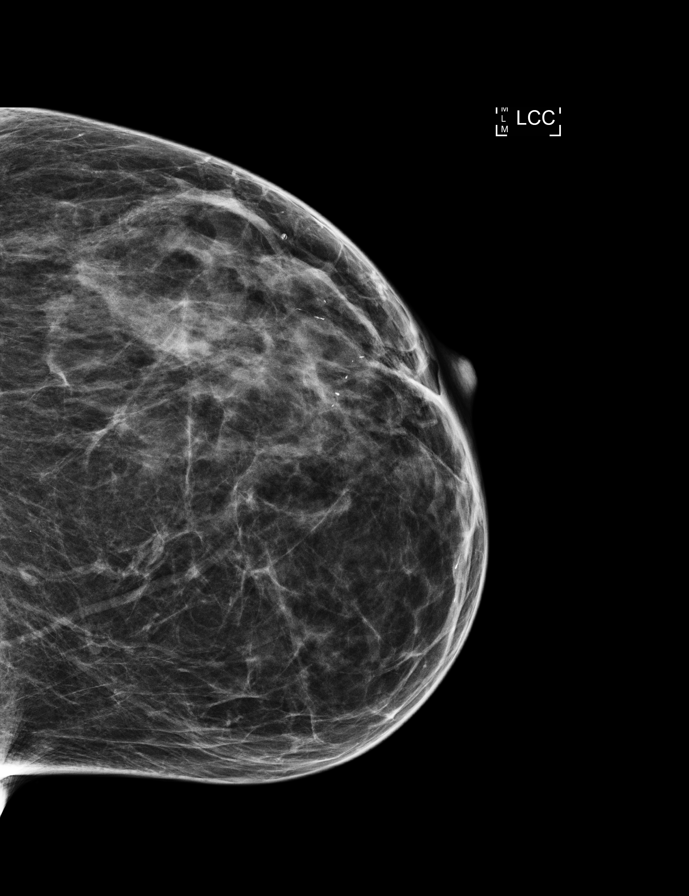

[L MLO]
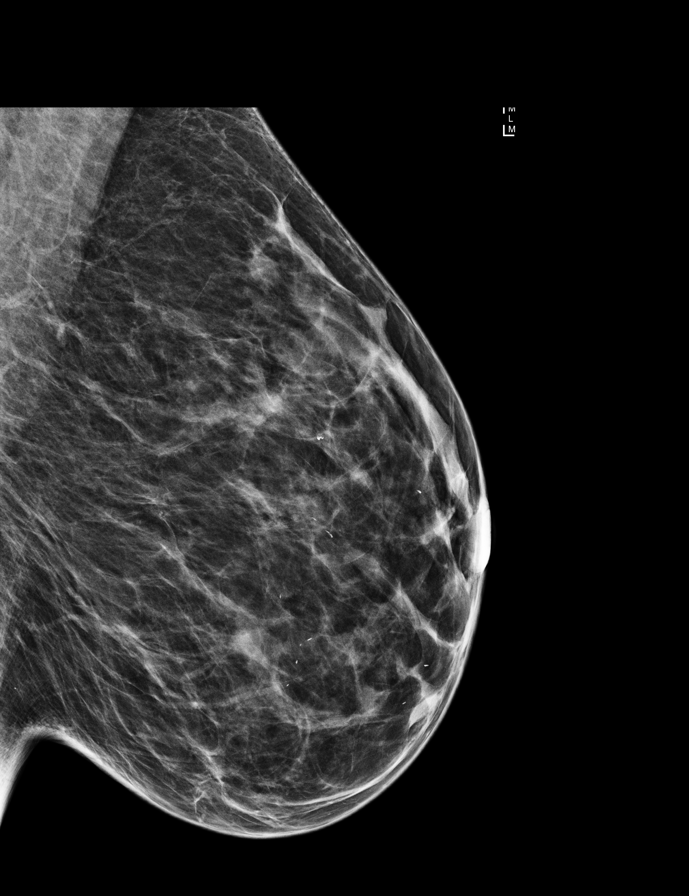

[L CC synth-2D]
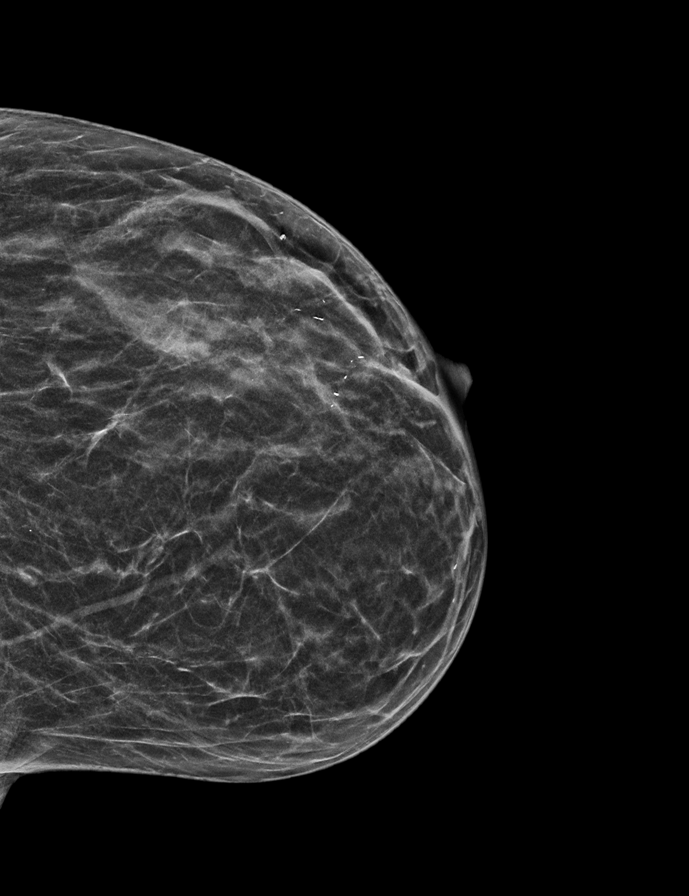

[R MLO synth-2D]
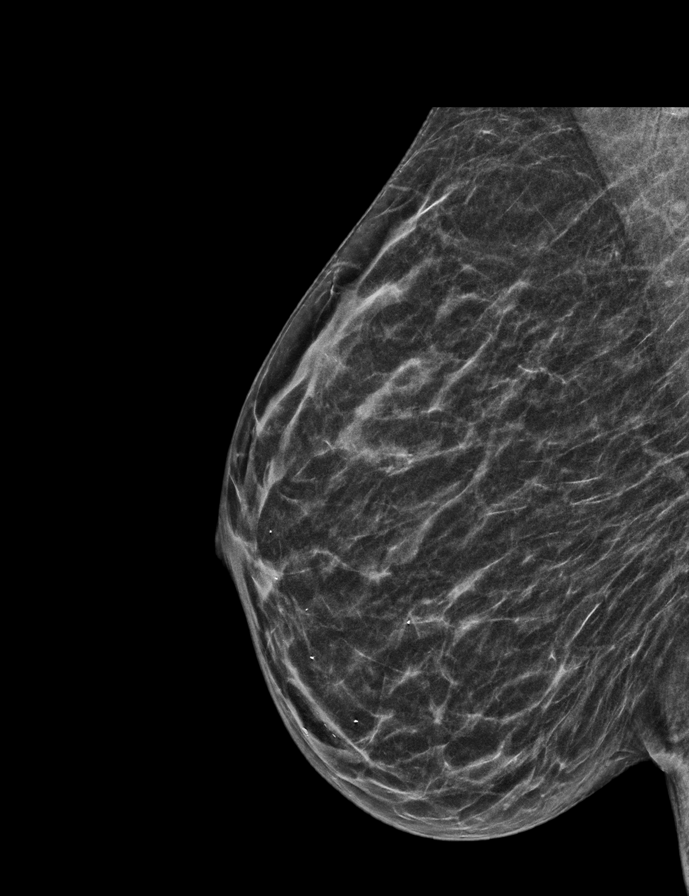

[R CC synth-2D]
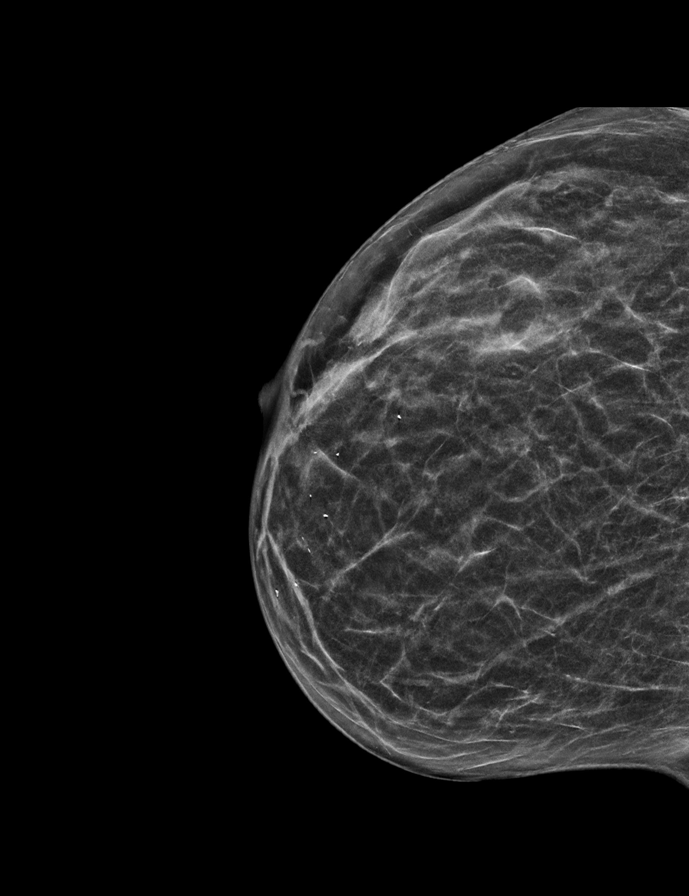

[L MLO synth-2D]
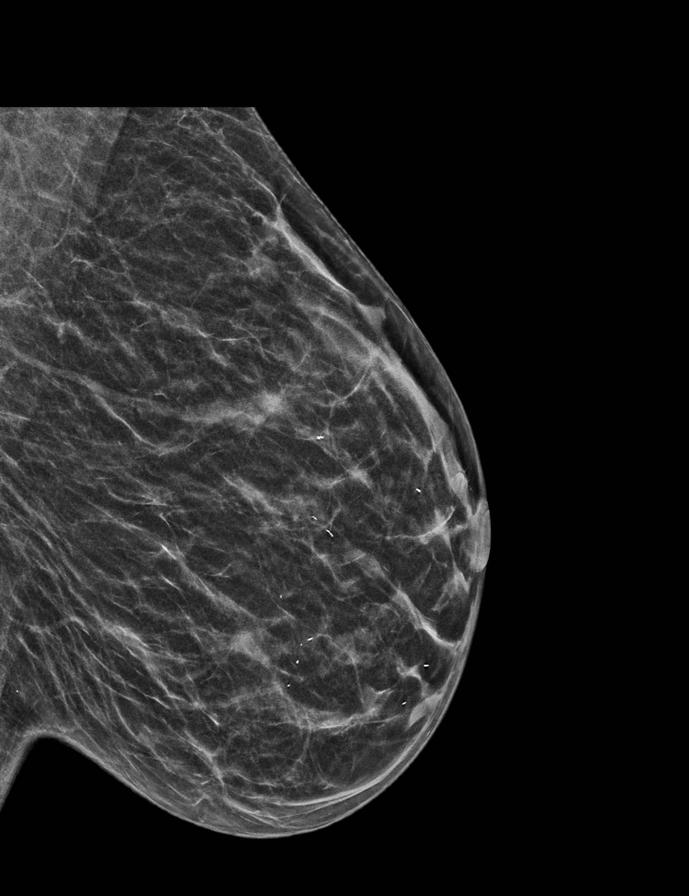

[R CC tomo · tomo slice 24/47.0]
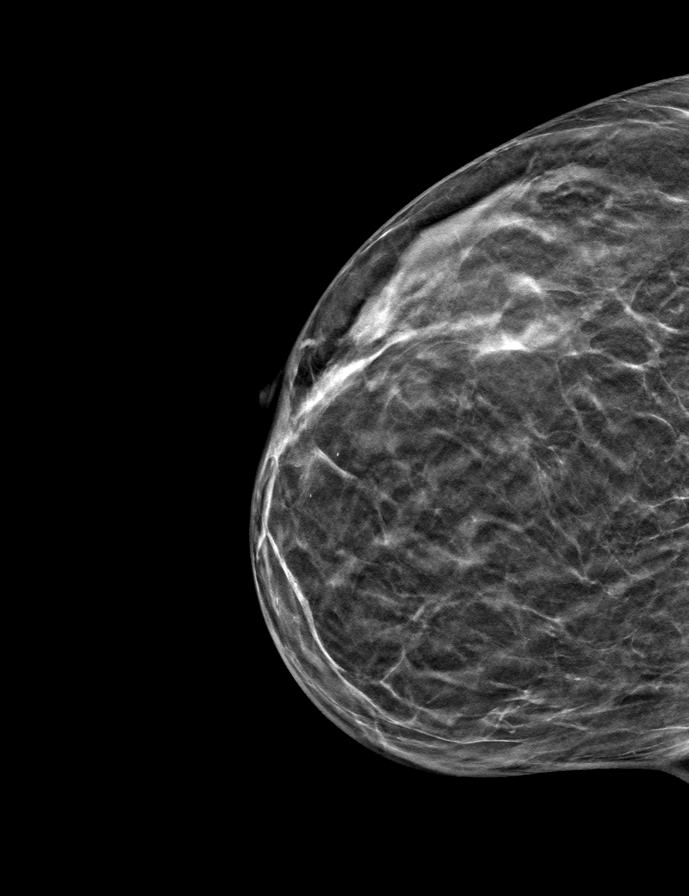

[9 of 28 positions shown; findings below may reference images not displayed]

ACR Breast Density Category b: There are scattered areas of
fibroglandular density.
FINDINGS: There are no findings suspicious for malignancy. Images were
processed with CAD.
IMPRESSION: No mammographic evidence of malignancy. A result letter of this
screening mammogram will be mailed directly to the patient.

RECOMMENDATION:
Screening mammogram in one year. (Code:GE-P-ZS0)

BI-RADS CATEGORY  1: Negative.

## 2020-02-27 ENCOUNTER — Encounter: Payer: Self-pay | Admitting: Gastroenterology
